# Patient Record
Sex: Female | Born: 1950 | Race: White | Hispanic: No | Marital: Married | Smoking: Never smoker
Health system: Southern US, Community
[De-identification: ages and names within clinical notes are randomized; demographics above are authoritative.]

## PROBLEM LIST (undated history)

## (undated) DIAGNOSIS — Z9889 Other specified postprocedural states: Secondary | ICD-10-CM

## (undated) DIAGNOSIS — M1990 Unspecified osteoarthritis, unspecified site: Secondary | ICD-10-CM

## (undated) DIAGNOSIS — R002 Palpitations: Secondary | ICD-10-CM

## (undated) DIAGNOSIS — E039 Hypothyroidism, unspecified: Secondary | ICD-10-CM

## (undated) DIAGNOSIS — I509 Heart failure, unspecified: Secondary | ICD-10-CM

## (undated) DIAGNOSIS — I499 Cardiac arrhythmia, unspecified: Secondary | ICD-10-CM

## (undated) DIAGNOSIS — E119 Type 2 diabetes mellitus without complications: Secondary | ICD-10-CM

## (undated) DIAGNOSIS — M109 Gout, unspecified: Secondary | ICD-10-CM

## (undated) DIAGNOSIS — R609 Edema, unspecified: Secondary | ICD-10-CM

## (undated) DIAGNOSIS — R112 Nausea with vomiting, unspecified: Secondary | ICD-10-CM

## (undated) DIAGNOSIS — K219 Gastro-esophageal reflux disease without esophagitis: Secondary | ICD-10-CM

## (undated) HISTORY — PX: CHOLECYSTECTOMY: SHX55

## (undated) HISTORY — PX: ABDOMINAL HYSTERECTOMY: SHX81

## (undated) HISTORY — PX: APPENDECTOMY: SHX54

## (undated) HISTORY — PX: NASAL SINUS SURGERY: SHX719

## (undated) HISTORY — PX: EYE SURGERY: SHX253

---

## 2005-07-24 ENCOUNTER — Ambulatory Visit: Payer: Self-pay | Admitting: Orthopedic Surgery

## 2005-07-24 ENCOUNTER — Other Ambulatory Visit: Payer: Self-pay

## 2007-03-05 ENCOUNTER — Ambulatory Visit: Payer: Self-pay | Admitting: Gastroenterology

## 2007-10-05 ENCOUNTER — Inpatient Hospital Stay: Payer: Self-pay | Admitting: Internal Medicine

## 2007-10-05 ENCOUNTER — Other Ambulatory Visit: Payer: Self-pay

## 2007-10-06 ENCOUNTER — Other Ambulatory Visit: Payer: Self-pay

## 2011-06-30 ENCOUNTER — Emergency Department: Payer: Self-pay | Admitting: *Deleted

## 2011-07-17 ENCOUNTER — Inpatient Hospital Stay: Payer: Self-pay | Admitting: Internal Medicine

## 2012-07-16 ENCOUNTER — Emergency Department: Payer: Self-pay | Admitting: Emergency Medicine

## 2012-07-16 LAB — URINALYSIS, COMPLETE
Bilirubin,UR: NEGATIVE
Glucose,UR: NEGATIVE mg/dL (ref 0–75)
Hyaline Cast: 22
Nitrite: NEGATIVE
Specific Gravity: 1.026 (ref 1.003–1.030)
Squamous Epithelial: 3
WBC UR: 21 /HPF (ref 0–5)

## 2012-07-16 LAB — CBC
HCT: 42.7 % (ref 35.0–47.0)
HGB: 14.7 g/dL (ref 12.0–16.0)
MCHC: 34.3 g/dL (ref 32.0–36.0)
MCV: 99 fL (ref 80–100)
RBC: 4.3 10*6/uL (ref 3.80–5.20)

## 2012-07-16 LAB — RAPID INFLUENZA A&B ANTIGENS

## 2012-07-16 LAB — TROPONIN I: Troponin-I: <0.02 ng/mL

## 2012-07-16 LAB — COMPREHENSIVE METABOLIC PANEL
Albumin: 3.6 g/dL (ref 3.4–5.0)
BUN: 16 mg/dL (ref 7–18)
Calcium, Total: 8 mg/dL — ABNORMAL LOW (ref 8.5–10.1)
Chloride: 99 mmol/L (ref 98–107)
Co2: 27 mmol/L (ref 21–32)
EGFR (African American): >60
Glucose: 146 mg/dL — ABNORMAL HIGH (ref 65–99)
Osmolality: 272 (ref 275–301)
Potassium: 3.7 mmol/L (ref 3.5–5.1)
SGOT(AST): 57 U/L — ABNORMAL HIGH (ref 15–37)
Sodium: 134 mmol/L — ABNORMAL LOW (ref 136–145)
Total Protein: 8 g/dL (ref 6.4–8.2)

## 2012-07-16 LAB — MAGNESIUM: Magnesium: 1 mg/dL — ABNORMAL LOW

## 2012-07-16 LAB — LIPASE, BLOOD: Lipase: 99 U/L (ref 73–393)

## 2013-03-19 ENCOUNTER — Ambulatory Visit: Payer: Self-pay | Admitting: Family Medicine

## 2013-08-09 ENCOUNTER — Ambulatory Visit: Payer: Self-pay | Admitting: Family Medicine

## 2013-12-02 ENCOUNTER — Ambulatory Visit: Payer: Self-pay | Admitting: Family Medicine

## 2014-06-16 ENCOUNTER — Emergency Department: Payer: Self-pay | Admitting: Emergency Medicine

## 2014-06-16 LAB — CK TOTAL AND CKMB (NOT AT ARMC)
CK, TOTAL: 84 U/L
CK-MB: 0.6 ng/mL (ref 0.5–3.6)

## 2014-06-16 LAB — CBC WITH DIFFERENTIAL/PLATELET
Basophil #: 0 10*3/uL (ref 0.0–0.1)
Basophil %: 0.3 %
EOS PCT: 1 %
Eosinophil #: 0.1 10*3/uL (ref 0.0–0.7)
HCT: 42.5 % (ref 35.0–47.0)
HGB: 14.3 g/dL (ref 12.0–16.0)
LYMPHS PCT: 22.7 %
Lymphocyte #: 2.9 10*3/uL (ref 1.0–3.6)
MCH: 32.2 pg (ref 26.0–34.0)
MCHC: 33.6 g/dL (ref 32.0–36.0)
MCV: 96 fL (ref 80–100)
Monocyte #: 0.8 x10 3/mm (ref 0.2–0.9)
Monocyte %: 6.7 %
NEUTROS ABS: 8.8 10*3/uL — AB (ref 1.4–6.5)
NEUTROS PCT: 69.3 %
Platelet: 174 10*3/uL (ref 150–440)
RBC: 4.44 10*6/uL (ref 3.80–5.20)
RDW: 13.7 % (ref 11.5–14.5)
WBC: 12.7 10*3/uL — AB (ref 3.6–11.0)

## 2014-06-16 LAB — BASIC METABOLIC PANEL
ANION GAP: 8 (ref 7–16)
BUN: 25 mg/dL — ABNORMAL HIGH (ref 7–18)
Calcium, Total: 8.8 mg/dL (ref 8.5–10.1)
Chloride: 104 mmol/L (ref 98–107)
Co2: 28 mmol/L (ref 21–32)
Creatinine: 0.81 mg/dL (ref 0.60–1.30)
EGFR (African American): >60
Glucose: 103 mg/dL — ABNORMAL HIGH (ref 65–99)
Osmolality: 284 (ref 275–301)
POTASSIUM: 3.5 mmol/L (ref 3.5–5.1)
Sodium: 140 mmol/L (ref 136–145)

## 2014-06-16 LAB — TROPONIN I: Troponin-I: <0.02 ng/mL

## 2014-11-20 NOTE — Discharge Summary (Signed)
PATIENT NAME:  Tammie Stone, Tammie Stone MR#:  [id number] DATE OF BIRTH:  1951-05-18  DATE OF ADMISSION:  07/17/2011 DATE OF DISCHARGE:  07/19/2011  PRIMARY CARE PHYSICIAN: Dr. Dario Guardian PRIMARY CARDIOLOGIST: Dr. Gwen Pounds   REASON FOR ADMISSION: Atrial fibrillation with heart palpitations.  DISCHARGE DIAGNOSES: 1. Atrial fibrillation with rapid ventricular response/paroxysmal atrial fibrillation.  2. Acute on chronic systolic congestive heart failure exacerbation with cardiac decompensation from rapid atrial fibrillation.  3. Possible acute bronchitis with cough and fever.  4. History of hypertension. 5. History of hyperlipidemia. 6. History of type 2 diabetes mellitus.  7. History of hypothyroidism. 8. History of gout. 9. History of chronic systolic congestive heart failure with ejection fraction 49%.   DISCHARGE MEDICATIONS:  1. Pradaxa 150 mg p.o. b.i.d.  2. Sotalol 160 mg b.i.d.  3. Cardizem CD 180 mg daily. 4. Lasix 40 mg daily.  5. Potassium chloride 20 mEq daily. 6. Zithromax 250 mg p.o. daily x4 days. 7. Omnicef 300 mg p.o. every 12 hours x4 days. 8. Flovent HFA 220 mcg 2 puffs inhaled q.12 hours x4 days.  9. Onglyza 5 mg daily.   10. Metformin 500 mg b.i.d.  11. Aspirin 81 mg daily.  12. Allopurinol 300 mg p.o. daily. 13. Synthroid 100 mcg daily. 14. Valtrex 500 mg daily as needed. 15. Fish oil 1000 mg 2 caps p.o. t.i.d.  16. TriCor 145 mg daily.  17. Crestor 10 mg daily. 18. Calcium 600 plus D 1 tab p.o. daily.  19. Omeprazole 20 mg b.i.d.   DISCHARGE DISPOSITION: Home.   DISCHARGE ACTIVITY: As tolerated.   DISCHARGE DIET: Low sodium, low fat, low cholesterol, ADA.   DISCHARGE INSTRUCTIONS:  1. Take medications as prescribed. 2. Return to Emergency Department for recurrence of symptoms or for development of shortness of breath, chest pain, heart palpitations or for worsening cough or fever or chills.   FOLLOW UP INSTRUCTIONS:  1. Follow up with Dr. Dario Guardian within 1 to  2 weeks. Patient needs repeat CMP within one week. 2. Follow up with Dr. Gwen Pounds or Dr. Lady Gary within one week.   CONSULTATION: Cardiology, Dr. Lady Gary.   LABORATORY, DIAGNOSTIC AND RADIOLOGICAL DATA:  Portable chest x-ray on 07/17/2011: Chest shows changes consistent with pulmonary vascular congestion and bilateral interstitial edema suggestive of congestive heart failure.   Repeat chest x-ray PA and lateral 07/19/2011: Observed improvement in the previously noted changes of pulmonary vascular congestion and bilateral interstitial edema. No new pulmonary infiltrates are seen.   Complete metabolic panel normal on admission except for AST slightly elevated at 46, serum glucose 162.  Cardiac enzymes negative x3 sets.   TSH 2.85.   CBC normal on admission except for WBC 11.5.   INR 1.2 on admission.   EKG on admission with atrial fibrillation with rapid ventricular response, heart rate in the 160s.   BRIEF HISTORY/HOSPITAL COURSE: Patient is a pleasant 64 year old female with past medical history of hypertension, hyperlipidemia, diabetes mellitus, gout, hypothyroidism, paroxysmal atrial fibrillation, mild chronic systolic congestive heart failure with ejection fraction 49% per recent echocardiogram as an outpatient who presented to the Emergency Department with complaints of shortness of breath and heart palpitations and was noted to be in rapid atrial fibrillation. Please see dictated admission history and physical for pertinent details surrounding the onset of this hospitalization. Please see below for further details.  1. Shortness of breath and heart palpitations due to atrial fibrillation with rapid ventricular response. Patient was on beta blocker therapy at the time of admission. She was  admitted to the Critical Care Unit and placed on a Cardizem drip for further heart rate control. She was also maintained on sotalol. With the measures mentioned above, patient had converted to normal sinus  rhythm and her heart rate had normalized. She was followed in-house by Dr. Lady Gary of cardiology. Dr. Lady Gary recommended transitioning patient off the IV Cardizem drip on to oral Cardizem with continuation of sotalol as well for heart rate control. Dr. Lady Gary was in agreement with continuation of aspirin and Pradaxa for cerebrovascular accident prophylaxis. Once her heart rate had normalized she was transitioned off IV Cardizem onto oral Cardizem and her heart rate has improved some. For optimal control her Cardizem dose has been advanced on the day of discharge. She will also continue sotalol and will follow up closely with cardiology as outpatient.  2. Acute on chronic systolic congestive heart failure exacerbation. Acute component was felt to be from cardiac decompensation due to rapid atrial fibrillation/sinus tachycardia. She did have some pulmonary edema noted on chest x-ray at the time of admission. She was placed on diuresis with IV Lasix initially. She has diuresed approximately three liters since hospital admission with an approximate 2.4 liters net negative balance since admission. Repeat chest x-ray shows improvement of pulmonary edema. With heart rate control as well as diuresis, her shortness of breath has essentially resolved. Once her condition was noted to have improved she was switched off IV Lasix onto p.o. Lasix. Cardiology recommends keeping patient on Lasix at 40 mg per day orally for now given her low ejection fraction at 49% to help with fluid balance. Her ARB is currently on hold as her blood pressure is currently normotensive and she has also been started on Lasix and Cardizem and we did not want precipitate hypotension so this may be able to be restarted as an outpatient if safe to do so per cardiology and she will follow up with Dr. Gwen Pounds or Dr. Lady Gary closely to see if and when it can be safe to resume her ARB.  3. Possible acute bronchitis. Patient's shortness of breath has essentially  resolved. She was noted to have a cough which is not very productive and she also had fever on 07/18/2011. There was no pneumonia noted on chest x-ray. Therefore, she was felt to have acute bronchitis at this time. She has been started on p.o. Omnicef and Z-Pak as well as one week's course of Flovent. She was advised to return to the ER if she notes worsening of her cough or develops worsening shortness of breath or fever or chills return.  4. Hypertension. Blood pressure well controlled on the day of discharge. Patient to continue Lasix, sotalol as well as Cardizem. 5. Hyperlipidemia. Patient to continue Crestor and TriCor. Crestor should be used cautiously given slightly elevated AST. She is currently asymptomatic and denies any abdominal pain, nausea or vomiting. Recommend repeat CMP per patient's primary care physician within one week. She was also advised to be cautious with using Tylenol or acetaminophen-containing products as well as alcohol.  6. Type 2 diabetes mellitus. Patient was maintained on sliding scale insulin while hospitalized. She can resume metformin and Onglyza upon hospital discharge in addition to ADA diet.  7. Hypothyroidism. Patient to continue Synthroid. Her TSH is within normal limits.  8. History of gout. Patient to continue allopurinol.  9. On 07/19/2011 patient is hemodynamically stable and without any shortness of breath and was noted to be in normal sinus rhythm with controlled heart rate and was felt  to be stable for discharge home with close outpatient follow up to which patient was agreeable.   TIME SPENT ON DISCHARGE: Greater than 30 minutes.   ____________________________ Elon AlasKamran N. Virgene Tirone, MD knl:cms D: 07/23/2011 19:00:49 ET T: 07/25/2011 10:33:38 ET JOB#: [id number]  cc: Elon AlasKamran N. Chamar Broughton, MD, <Dictator> Marlyn CorporalFayegh H. Jadali, MD Lamar BlinksBruce J. Kowalski, MD Darlin PriestlyKenneth A. Lady GaryFath, MD Elon AlasKAMRAN N Wilson Sample MD ELECTRONICALLY SIGNED 08/01/2011 16:24

## 2015-03-28 ENCOUNTER — Encounter: Payer: Self-pay | Admitting: *Deleted

## 2015-03-30 ENCOUNTER — Ambulatory Visit: Payer: Self-pay | Admitting: Anesthesiology

## 2015-03-30 ENCOUNTER — Encounter: Payer: Self-pay | Admitting: *Deleted

## 2015-03-30 ENCOUNTER — Ambulatory Visit
Admission: RE | Admit: 2015-03-30 | Discharge: 2015-03-30 | Disposition: A | Discharge status: Home / Self Care | Payer: Self-pay | Source: Ambulatory Visit | Attending: Ophthalmology | Admitting: Ophthalmology

## 2015-03-30 ENCOUNTER — Encounter
Admission: RE | Disposition: A | Discharge status: Home / Self Care | Payer: Self-pay | Source: Ambulatory Visit | Attending: Ophthalmology

## 2015-03-30 DIAGNOSIS — Z885 Allergy status to narcotic agent status: Secondary | ICD-10-CM | POA: Insufficient documentation

## 2015-03-30 DIAGNOSIS — M1990 Unspecified osteoarthritis, unspecified site: Secondary | ICD-10-CM | POA: Insufficient documentation

## 2015-03-30 DIAGNOSIS — E039 Hypothyroidism, unspecified: Secondary | ICD-10-CM | POA: Insufficient documentation

## 2015-03-30 DIAGNOSIS — E669 Obesity, unspecified: Secondary | ICD-10-CM | POA: Insufficient documentation

## 2015-03-30 DIAGNOSIS — H268 Other specified cataract: Secondary | ICD-10-CM | POA: Insufficient documentation

## 2015-03-30 DIAGNOSIS — Z6841 Body Mass Index (BMI) 40.0 and over, adult: Secondary | ICD-10-CM | POA: Insufficient documentation

## 2015-03-30 DIAGNOSIS — K219 Gastro-esophageal reflux disease without esophagitis: Secondary | ICD-10-CM | POA: Insufficient documentation

## 2015-03-30 DIAGNOSIS — I10 Essential (primary) hypertension: Secondary | ICD-10-CM | POA: Insufficient documentation

## 2015-03-30 DIAGNOSIS — I509 Heart failure, unspecified: Secondary | ICD-10-CM | POA: Insufficient documentation

## 2015-03-30 DIAGNOSIS — Z882 Allergy status to sulfonamides status: Secondary | ICD-10-CM | POA: Insufficient documentation

## 2015-03-30 DIAGNOSIS — E119 Type 2 diabetes mellitus without complications: Secondary | ICD-10-CM | POA: Insufficient documentation

## 2015-03-30 HISTORY — DX: Heart failure, unspecified: I50.9

## 2015-03-30 HISTORY — DX: Hypothyroidism, unspecified: E03.9

## 2015-03-30 HISTORY — DX: Other specified postprocedural states: R11.2

## 2015-03-30 HISTORY — DX: Gastro-esophageal reflux disease without esophagitis: K21.9

## 2015-03-30 HISTORY — PX: CATARACT EXTRACTION W/PHACO: SHX586

## 2015-03-30 HISTORY — DX: Gout, unspecified: M10.9

## 2015-03-30 HISTORY — DX: Edema, unspecified: R60.9

## 2015-03-30 HISTORY — DX: Unspecified osteoarthritis, unspecified site: M19.90

## 2015-03-30 HISTORY — DX: Cardiac arrhythmia, unspecified: I49.9

## 2015-03-30 HISTORY — DX: Type 2 diabetes mellitus without complications: E11.9

## 2015-03-30 HISTORY — DX: Other specified postprocedural states: Z98.890

## 2015-03-30 LAB — GLUCOSE, CAPILLARY: Glucose-Capillary: 133 mg/dL — ABNORMAL HIGH (ref 65–99)

## 2015-03-30 LAB — POTASSIUM: Potassium: 4.2 mmol/L (ref 3.5–5.1)

## 2015-03-30 SURGERY — PHACOEMULSIFICATION, CATARACT, WITH IOL INSERTION
Anesthesia: Monitor Anesthesia Care | Site: Eye | Laterality: Right | Wound class: Clean

## 2015-03-30 MED ORDER — NEOMYCIN-POLYMYXIN-DEXAMETH 0.1 % OP OINT
TOPICAL_OINTMENT | OPHTHALMIC | Status: DC | PRN
  Administered 2015-03-30: 1 via OPHTHALMIC

## 2015-03-30 MED ORDER — MOXIFLOXACIN HCL 0.5 % OP SOLN
OPHTHALMIC | Status: AC
  Filled 2015-03-30: qty 3

## 2015-03-30 MED ORDER — ONDANSETRON HCL 4 MG/2ML IJ SOLN
INTRAMUSCULAR | Status: DC | PRN
  Administered 2015-03-30: 4 mg via INTRAVENOUS

## 2015-03-30 MED ORDER — NEOMYCIN-POLYMYXIN-DEXAMETH 3.5-10000-0.1 OP OINT
TOPICAL_OINTMENT | OPHTHALMIC | Status: AC
  Filled 2015-03-30: qty 3.5

## 2015-03-30 MED ORDER — ARMC OPHTHALMIC DILATING GEL
OPHTHALMIC | Status: AC
  Administered 2015-03-30: 1 via OPHTHALMIC
  Filled 2015-03-30: qty 0.25

## 2015-03-30 MED ORDER — SODIUM CHLORIDE 0.9 % IV SOLN
INTRAVENOUS | Status: DC
  Administered 2015-03-30: 07:00:00 via INTRAVENOUS

## 2015-03-30 MED ORDER — NA HYALUR & NA CHOND-NA HYALUR 0.4-0.35 ML IO KIT
PACK | INTRAOCULAR | Status: DC | PRN
  Administered 2015-03-30: .75 mL via INTRAOCULAR

## 2015-03-30 MED ORDER — SODIUM HYALURONATE 10 MG/ML IO SOLN
INTRAOCULAR | Status: AC
  Filled 2015-03-30: qty 0.85

## 2015-03-30 MED ORDER — SODIUM HYALURONATE 23 MG/ML IO SOLN
INTRAOCULAR | Status: DC | PRN
  Administered 2015-03-30: 0.6 mL via INTRAOCULAR

## 2015-03-30 MED ORDER — TRYPAN BLUE 0.06 % OP SOLN
OPHTHALMIC | Status: DC | PRN
  Administered 2015-03-30: 0.5 mL via INTRAOCULAR

## 2015-03-30 MED ORDER — CEFUROXIME OPHTHALMIC INJECTION 1 MG/0.1 ML
INJECTION | OPHTHALMIC | Status: AC
  Filled 2015-03-30: qty 0.1

## 2015-03-30 MED ORDER — MIDAZOLAM HCL 2 MG/2ML IJ SOLN
INTRAMUSCULAR | Status: DC | PRN
  Administered 2015-03-30: 1 mg via INTRAVENOUS

## 2015-03-30 MED ORDER — BUPIVACAINE HCL (PF) 0.75 % IJ SOLN
INTRAMUSCULAR | Status: AC
  Filled 2015-03-30: qty 10

## 2015-03-30 MED ORDER — ARMC OPHTHALMIC DILATING GEL
1.0000 "application " | OPHTHALMIC | Status: DC | PRN
  Administered 2015-03-30: 1 via OPHTHALMIC

## 2015-03-30 MED ORDER — EPINEPHRINE HCL 1 MG/ML IJ SOLN
INTRAMUSCULAR | Status: AC
  Filled 2015-03-30: qty 1

## 2015-03-30 MED ORDER — MOXIFLOXACIN HCL 0.5 % OP SOLN: 1.0000 [drp] | OPHTHALMIC | Status: DC | PRN

## 2015-03-30 MED ORDER — TETRACAINE HCL 0.5 % OP SOLN
OPHTHALMIC | Status: AC
  Administered 2015-03-30: 1 [drp] via OPHTHALMIC
  Filled 2015-03-30: qty 2

## 2015-03-30 MED ORDER — NA HYALUR & NA CHOND-NA HYALUR 0.55-0.5 ML IO KIT
PACK | INTRAOCULAR | Status: AC
  Filled 2015-03-30: qty 1.05

## 2015-03-30 MED ORDER — TRYPAN BLUE 0.06 % OP SOLN
OPHTHALMIC | Status: AC
  Filled 2015-03-30: qty 0.5

## 2015-03-30 MED ORDER — POVIDONE-IODINE 5 % OP SOLN
OPHTHALMIC | Status: AC
  Administered 2015-03-30: 1 via OPHTHALMIC
  Filled 2015-03-30: qty 30

## 2015-03-30 MED ORDER — EPINEPHRINE HCL 1 MG/ML IJ SOLN
INTRAOCULAR | Status: DC | PRN
  Administered 2015-03-30: 200 mL via OPHTHALMIC

## 2015-03-30 MED ORDER — FENTANYL CITRATE (PF) 100 MCG/2ML IJ SOLN
INTRAMUSCULAR | Status: DC | PRN
  Administered 2015-03-30: 50 ug via INTRAVENOUS

## 2015-03-30 MED ORDER — LIDOCAINE HCL (PF) 4 % IJ SOLN
INTRAMUSCULAR | Status: AC
  Filled 2015-03-30: qty 5

## 2015-03-30 MED ORDER — HYALURONIDASE HUMAN 150 UNIT/ML IJ SOLN
INTRAMUSCULAR | Status: AC
Start: 2015-03-30 — End: 2015-03-30
  Filled 2015-03-30: qty 1

## 2015-03-30 MED ORDER — CARBACHOL 0.01 % IO SOLN
INTRAOCULAR | Status: DC | PRN
Start: 2015-03-30 — End: 2015-03-30
  Administered 2015-03-30: 0.5 mL via INTRAOCULAR

## 2015-03-30 MED ORDER — TETRACAINE HCL 0.5 % OP SOLN
1.0000 [drp] | OPHTHALMIC | Status: AC | PRN
  Administered 2015-03-30: 1 [drp] via OPHTHALMIC

## 2015-03-30 MED ORDER — CEFUROXIME OPHTHALMIC INJECTION 1 MG/0.1 ML
INJECTION | OPHTHALMIC | Status: DC | PRN
  Administered 2015-03-30: 0.1 mL via INTRACAMERAL

## 2015-03-30 MED ORDER — POVIDONE-IODINE 5 % OP SOLN
1.0000 "application " | OPHTHALMIC | Status: AC | PRN
  Administered 2015-03-30: 1 via OPHTHALMIC

## 2015-03-30 SURGICAL SUPPLY — 23 items
CANNULA ANT/CHMB 27G (MISCELLANEOUS) ×1 IMPLANT
CANNULA ANT/CHMB 27GA (MISCELLANEOUS) ×3 IMPLANT
CUP MEDICINE 2OZ PLAST GRAD ST (MISCELLANEOUS) ×3 IMPLANT
GLOVE BIO SURGEON STRL SZ8 (GLOVE) ×3 IMPLANT
GLOVE BIOGEL M 6.5 STRL (GLOVE) ×3 IMPLANT
GLOVE SURG LX 7.5 STRW (GLOVE) ×2
GLOVE SURG LX STRL 7.5 STRW (GLOVE) ×1 IMPLANT
GOWN STRL REUS W/ TWL LRG LVL3 (GOWN DISPOSABLE) ×2 IMPLANT
GOWN STRL REUS W/TWL LRG LVL3 (GOWN DISPOSABLE) ×6
LENS IOL TECNIS 20.0 (Intraocular Lens) ×3 IMPLANT
LENS IOL TECNIS MONO 1P 20.0 (Intraocular Lens) IMPLANT
PACK CATARACT (MISCELLANEOUS) ×3 IMPLANT
PACK CATARACT BRASINGTON LX (MISCELLANEOUS) ×3 IMPLANT
PACK EYE AFTER SURG (MISCELLANEOUS) ×3 IMPLANT
SOL BSS BAG (MISCELLANEOUS) ×3
SOL PREP PVP 2OZ (MISCELLANEOUS) ×3
SOLUTION BSS BAG (MISCELLANEOUS) ×1 IMPLANT
SOLUTION PREP PVP 2OZ (MISCELLANEOUS) ×1 IMPLANT
SYR 3ML LL SCALE MARK (SYRINGE) ×5 IMPLANT
SYR 5ML LL (SYRINGE) ×3 IMPLANT
SYR TB 1ML 27GX1/2 LL (SYRINGE) ×3 IMPLANT
WATER STERILE IRR 1000ML POUR (IV SOLUTION) ×3 IMPLANT
WIPE NON LINTING 3.25X3.25 (MISCELLANEOUS) ×3 IMPLANT

## 2015-03-30 NOTE — Anesthesia Preprocedure Evaluation (Signed)
Anesthesia Evaluation  Patient identified by MRN, date of birth, ID band Patient awake    Reviewed: Allergy & Precautions, NPO status , Patient's Chart, lab work & pertinent test results, reviewed documented beta blocker date and time   History of Anesthesia Complications (+) PONV and history of anesthetic complications  Airway Mallampati: III  TM Distance: >3 FB     Dental  (+) Chipped   Pulmonary          Cardiovascular hypertension, Pt. on medications and Pt. on home beta blockers +CHF + dysrhythmias     Neuro/Psych    GI/Hepatic GERD-  ,  Endo/Other  diabetes, Type 2Hypothyroidism   Renal/GU      Musculoskeletal  (+) Arthritis -,   Abdominal   Peds  Hematology   Anesthesia Other Findings Obesity.  Reproductive/Obstetrics                             Anesthesia Physical Anesthesia Plan  ASA: III  Anesthesia Plan: MAC   Post-op Pain Management:    Induction:   Airway Management Planned: Nasal Cannula  Additional Equipment:   Intra-op Plan:   Post-operative Plan:   Informed Consent: I have reviewed the patients History and Physical, chart, labs and discussed the procedure including the risks, benefits and alternatives for the proposed anesthesia with the patient or authorized representative who has indicated his/her understanding and acceptance.     Plan Discussed with: CRNA  Anesthesia Plan Comments:         Anesthesia Quick Evaluation

## 2015-03-30 NOTE — Op Note (Signed)
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  Vision Blue dye was used to stain the lens capsule.  LENS:   Implant Name Type Inv. Item Serial No. Manufacturer Lot No. LRB No. Used  LENS IMPL INTRAOC ZCB00 20.0 - [id number] Intraocular Lens LENS IMPL INTRAOC ZCB00 20.0 [id number]   Right 1       ULTRASOUND TIME: 22 % of 1 minutes 30 seconds, CDE 19.6  SURGEON:  Deirdre Evener, MD   ANESTHESIA:  Topical with tetracaine drops and 2% Xylocaine jelly.   COMPLICATIONS:  None.   DESCRIPTION OF PROCEDURE:  The patient was identified in the holding room and transported to the operating room and placed in the supine position under the operating microscope. Theright eye was identified as the operative eye and it was prepped and draped in the usual sterile ophthalmic fashion.  A 1 millimeter clear-corneal paracentesis was made at the 12:00 position.  The anterior chamber was filled with Healon 5 viscoelastic.  A 2.4 millimeter keratome was used to make a near-clear corneal incision at the 9:00 position.  The anterior chamber was filled with Healon 5 viscoelastic.  Vision Blue dye was then injected under the viscoelastic to stain the lens capsule.  BSS was then used to wash the dye out.  Additional Healon 5 was placed into the anterior chamber.  A curvilinear capsulorrhexis was made with a cystotome and capsulorrhexis forceps.  Balanced salt solution was used to hydrodissect and hydrodelineate the nucleus.  Viscoat was then placed in the anterior chamber.   Phacoemulsification was then used in stop and chop fashion to remove the lens nucleus and epinucleus.  The remaining cortex was then removed using the irrigation and aspiration  handpiece. Provisc was then placed into the capsular bag to distend it for lens placement.  A 20.0 -diopter lens was then injected into the capsular bag.  The remaining viscoelastic was aspirated.   Wounds were hydrated with balanced salt solution.  The anterior chamber was inflated to a physiologic pressure with balanced salt solution. Cefuroxime 0.1 ml of a /ml solution was injected into the anterior chamber for a dose of 1 mg of intracameral antibiotic at the completion of the case. Miostat was placed into the anterior chamber to constrict the pupil.  No wound leaks were noted.  Topical Vigamox drops and Maxitrol ointment were applied to the eye.  The patient was taken to the recovery room in stable condition without complications of anesthesia or surgery.  Billye Pickerel 03/30/2015, 8:41 AM

## 2015-03-30 NOTE — Transfer of Care (Signed)
Immediate Anesthesia Transfer of Care Note  Patient: Tammie Stone  Procedure(s) Performed: Procedure(s) with comments: CATARACT EXTRACTION PHACO AND INTRAOCULAR LENS PLACEMENT (IOC) (Right) - [place]: 01:29.7 AP%; 21.9 CDE: 19.58  Lot # [id number]  Patient Location: PHASE II  Anesthesia Type:MAC  Level of Consciousness: Awake, Alert, Oriented  Airway & Oxygen Therapy: Patient Spontanous Breathing and Patient on room air   Post-op Assessment: Report given to RN and Post -op Vital signs reviewed and stable  Post vital signs: Reviewed and stable  Last Vitals:  Filed Vitals:   03/30/15 0838  BP: 116/68  Pulse: 107  Temp: 36.2 C  Resp:     Complications: No apparent anesthesia complications

## 2015-03-30 NOTE — Discharge Instructions (Signed)
AMBULATORY SURGERY  DISCHARGE INSTRUCTIONS   1) The drugs that you were given will stay in your system until tomorrow so for the next 24 hours you should not:  A) Drive an automobile B) Make any legal decisions C) Drink any alcoholic beverage   2) You may resume regular meals tomorrow.  Today it is better to start with liquids and gradually work up to solid foods.  You may eat anything you prefer, but it is better to start with liquids, then soup and crackers, and gradually work up to solid foods.   3) Please notify your doctor immediately if you have any unusual bleeding, trouble breathing, redness and pain at the surgery site, drainage, fever, or pain not relieved by medication.    4) Additional Instructions:   Eye Surgery Discharge Instructions  Expect mild scratchy sensation or mild soreness. DO NOT RUB YOUR EYE!  The day of surgery:  Minimal physical activity, but bed rest is not required  No reading, computer work, or close hand work  No bending, lifting, or straining.  May watch TV  For 24 hours:  No driving, legal decisions, or alcoholic beverages  Safety precautions  Eat anything you prefer: It is better to start with liquids, then soup then solid foods.  _____ Eye patch should be worn until postoperative exam tomorrow.  ____ Solar shield eyeglasses should be worn for comfort in the sunlight/patch while sleeping  Resume all regular medications including aspirin or Coumadin if these were discontinued prior to surgery. You may shower, bathe, shave, or wash your hair. Tylenol may be taken for mild discomfort.  Call your doctor if you experience significant pain, nausea, or vomiting, fever > 101 or other signs of infection. [phone] or [phone] Specific instructions:  Follow-up Information    Follow up with Lockie Mola, MD In 1 day.   Specialty:  Ophthalmology   Why:  September 2 at 10:40am   Contact information:   [address]   [place] [address] [phone]          Please contact your physician with any problems or Same Day Surgery at [phone], Monday through Friday 6 am to 4 pm, or [place] at [facility] number at [phone].

## 2015-03-30 NOTE — Anesthesia Postprocedure Evaluation (Signed)
  Anesthesia Post-op Note  Patient: Tammie Stone  Procedure(s) Performed: Procedure(s) with comments: CATARACT EXTRACTION PHACO AND INTRAOCULAR LENS PLACEMENT (IOC) (Right) - [place]: 01:29.7 AP%; 21.9 CDE: 19.58  Lot # [id number]  Anesthesia type:MAC  Patient location: Phase II  Post pain: Pain level controlled  Post assessment: Post-op Vital signs reviewed, Patient's Cardiovascular Status Stable, Respiratory Function Stable, Patent Airway and No signs of Nausea or vomiting  Post vital signs: Reviewed and stable  Last Vitals:  Filed Vitals:   03/30/15 0838  BP: 116/68  Pulse: 107  Temp: 36.2 C  Resp:     Level of consciousness: awake, alert  and patient cooperative  Complications: No apparent anesthesia complications

## 2015-03-30 NOTE — Anesthesia Procedure Notes (Signed)
Procedure Name: MAC Date/Time: 03/30/2015 8:07 AM Performed by: Stormy Fabian Pre-anesthesia Checklist: Patient identified, Emergency Drugs available, Suction available and Patient being monitored Patient Re-evaluated:Patient Re-evaluated prior to inductionOxygen Delivery Method: Nasal cannula

## 2015-03-30 NOTE — H&P (Signed)
  The History and Physical notes were scanned in.  The patient remains stable and unchanged from the H&P.   Previous H&P reviewed, patient examined, and there are no changes.  Tammie Stone 03/30/2015 8:00 AM

## 2015-07-01 ENCOUNTER — Emergency Department
Admission: EM | Admit: 2015-07-01 | Discharge: 2015-07-01 | Disposition: A | Discharge status: Home / Self Care | Payer: Self-pay | Attending: Emergency Medicine | Admitting: Emergency Medicine

## 2015-07-01 ENCOUNTER — Encounter: Payer: Self-pay | Admitting: Emergency Medicine

## 2015-07-01 ENCOUNTER — Emergency Department: Payer: Self-pay

## 2015-07-01 DIAGNOSIS — Y9289 Other specified places as the place of occurrence of the external cause: Secondary | ICD-10-CM | POA: Insufficient documentation

## 2015-07-01 DIAGNOSIS — Z7984 Long term (current) use of oral hypoglycemic drugs: Secondary | ICD-10-CM | POA: Insufficient documentation

## 2015-07-01 DIAGNOSIS — Y9301 Activity, walking, marching and hiking: Secondary | ICD-10-CM | POA: Insufficient documentation

## 2015-07-01 DIAGNOSIS — S92352A Displaced fracture of fifth metatarsal bone, left foot, initial encounter for closed fracture: Secondary | ICD-10-CM | POA: Insufficient documentation

## 2015-07-01 DIAGNOSIS — Y998 Other external cause status: Secondary | ICD-10-CM | POA: Insufficient documentation

## 2015-07-01 DIAGNOSIS — E119 Type 2 diabetes mellitus without complications: Secondary | ICD-10-CM | POA: Insufficient documentation

## 2015-07-01 DIAGNOSIS — W108XXA Fall (on) (from) other stairs and steps, initial encounter: Secondary | ICD-10-CM | POA: Insufficient documentation

## 2015-07-01 DIAGNOSIS — Z79899 Other long term (current) drug therapy: Secondary | ICD-10-CM | POA: Insufficient documentation

## 2015-07-01 DIAGNOSIS — S92302A Fracture of unspecified metatarsal bone(s), left foot, initial encounter for closed fracture: Secondary | ICD-10-CM

## 2015-07-01 DIAGNOSIS — Z794 Long term (current) use of insulin: Secondary | ICD-10-CM | POA: Insufficient documentation

## 2015-07-01 MED ORDER — HYDROCODONE-ACETAMINOPHEN 5-325 MG PO TABS: 1.0000 | ORAL_TABLET | ORAL | Status: DC | PRN

## 2015-07-01 NOTE — ED Provider Notes (Signed)
[facility] Emergency Department Provider Note  ____________________________________________  Time seen: Approximately 2:00 PM  I have reviewed the triage vital signs and the nursing notes.   HISTORY  Chief Complaint Foot Injury    HPI Tammie Stone is a 64 y.o. female who presents emergency Department with left foot pain status post an injury. She states that she was walking down some stairs, took the last step, rotated her ankle in an inversion manner. She states that while doing so she fell. She denies hitting her head or lose consciousness. She states that she has a history of repeated ankle sprains in left ankle. She is now endorsing some pain in the mid foot on the lateral aspect. Patient endorses edema to area as well. Patient denies any numbness or tingling in distal extremity. She states the fall was mechanical in nature.Pain is constant, mild to moderate at rest but severe with weightbearing.   Past Medical History  Diagnosis Date  . Dysrhythmia     afib  . CHF (congestive heart failure) (HCC)   . GERD (gastroesophageal reflux disease)   . Diabetes mellitus without complication (HCC)   . Gout   . Hypothyroidism   . Arthritis   . PONV (postoperative nausea and vomiting)   . Edema     feet    There are no active problems to display for this patient.   Past Surgical History  Procedure Laterality Date  . Appendectomy    . Cholecystectomy    . Abdominal hysterectomy    . Cataract extraction w/phaco Right 03/30/2015    Procedure: CATARACT EXTRACTION PHACO AND INTRAOCULAR LENS PLACEMENT (IOC);  Surgeon: Lockie Molahadwick Brasington, MD;  Location: [facility] ORS;  Service: Ophthalmology;  Laterality: Right;  US: 01:29.7 AP%; 21.9 CDE: 19.58  Lot # [id number] H    Current Outpatient Rx  Name  Route  Sig  Dispense  Refill  . allopurinol (ZYLOPRIM) 100 MG tablet   Oral   Take 100 mg by mouth daily.         Marland Kitchen. diltiazem (DILACOR XR) 180 MG 24 hr capsule   Oral   Take 180 mg by mouth daily.         . furosemide (LASIX) 40 MG tablet   Oral   Take 40 mg by mouth 2 (two) times daily.         Marland Kitchen. HYDROcodone-acetaminophen (NORCO/VICODIN) 5-325 MG tablet   Oral   Take 1 tablet by mouth every 4 (four) hours as needed for moderate pain.   20 tablet   0   . hydrOXYzine (ATARAX/VISTARIL) 25 MG tablet   Oral   Take 25 mg by mouth at bedtime.         . insulin detemir (LEVEMIR) 100 UNIT/ML injection   Subcutaneous   Inject 70 Units into the skin at bedtime.         Marland Kitchen. levothyroxine (SYNTHROID, LEVOTHROID) 112 MCG tablet   Oral   Take 112 mcg by mouth daily before breakfast.         . metoprolol succinate (TOPROL-XL) 50 MG 24 hr tablet   Oral   Take 50 mg by mouth daily. Take with or immediately following a meal.         . potassium chloride SA (K-DUR,KLOR-CON) 20 MEQ tablet   Oral   Take 20 mEq by mouth daily.         . ranitidine (ZANTAC) 150 MG tablet   Oral   Take 150 mg by  mouth at bedtime.         Marland Kitchen SAXagliptin-MetFORMIN (KOMBIGLYZE XR PO)   Oral   Take 500 mg by mouth daily.           Allergies Codeine and Sulfa antibiotics  History reviewed. No pertinent family history.  Social History Social History  Substance Use Topics  . Smoking status: Never Smoker   . Smokeless tobacco: None  . Alcohol Use: No    Review of Systems Constitutional: No fever/chills Eyes: No visual changes. ENT: No sore throat. Cardiovascular: Denies chest pain. Respiratory: Denies shortness of breath. Gastrointestinal: No abdominal pain.  No nausea, no vomiting.  No diarrhea.  No constipation. Genitourinary: Negative for dysuria. Musculoskeletal: Negative for back pain. Dorsalis left ankle/foot pain. Skin: Negative for rash. Neurological: Negative for headaches, focal weakness or numbness.  10-point ROS otherwise negative.  ____________________________________________   PHYSICAL EXAM:  VITAL SIGNS: ED Triage  Vitals  Enc Vitals Group     BP 07/01/15 1333 117/89 mmHg     Pulse Rate 07/01/15 1333 65     Resp 07/01/15 1333 20     Temp 07/01/15 1333 98.1 F (36.7 C)     Temp Source 07/01/15 1333 Oral     SpO2 07/01/15 1333 97 %     Weight 07/01/15 1333 250 lb (113.399 kg)     Height 07/01/15 1333  (1.702 m)     Head Cir --      Peak Flow --      Pain Score 07/01/15 1323 8     Pain Loc --      Pain Edu? --      Excl. in GC? --     Constitutional: Alert and oriented. Well appearing and in no acute distress. Eyes: Conjunctivae are normal. PERRL. EOMI. Head: Atraumatic. Nose: No congestion/rhinnorhea. Mouth/Throat: Mucous membranes are moist.  Oropharynx non-erythematous. Neck: No stridor.   Cardiovascular: Normal rate, regular rhythm. Grossly normal heart sounds.  Good peripheral circulation. Respiratory: Normal respiratory effort.  No retractions. Lungs CTAB. Gastrointestinal: Soft and nontender. No distention. No abdominal bruits. No CVA tenderness. Musculoskeletal: No lower extremity tenderness nor edema.  No joint effusions. Edema noted to lateral aspect mid foot left foot compared to right foot. Mild edema noted to left lateral malleolus. Patient is very tender to palpation over the base of the fifth metatarsal. No other tenderness to palpation. Full range of motion to ankle joint. Sensation and pulses are intact in extremity. Neurologic:  Normal speech and language. No gross focal neurologic deficits are appreciated. No gait instability. Skin:  Skin is warm, dry and intact. No rash noted. Psychiatric: Mood and affect are normal. Speech and behavior are normal.  ____________________________________________   LABS (all labs ordered are listed, but only abnormal results are displayed)  Labs Reviewed - No data to display ____________________________________________  EKG   ____________________________________________  RADIOLOGY  Left ankle x-ray Impression: Acute-appearing  fifth metatarsal base fracture. Chronic-appearing medial and lateral malleolus fractures.  Images were reviewed by myself. ____________________________________________   PROCEDURES  Procedure(s) performed: Yes, splint application, see procedure note(s).   SPLINT APPLICATION Date/Time: 2:41 PM Authorized by: Racheal Patches Consent: Verbal consent obtained. Risks and benefits: risks, benefits and alternatives were discussed Consent given by: patient Splint applied by: orthopedic technician Location details: Ankle  Splint type: Posterior OCL  Supplies used: Ortho-Glass  Post-procedure: The splinted body part was neurovascularly unchanged following the procedure. Patient tolerance: Patient tolerated the procedure well with no immediate complications.  Critical Care performed: No  ____________________________________________   INITIAL IMPRESSION / ASSESSMENT AND PLAN / ED COURSE  Pertinent labs & imaging results that were available during my care of the patient were reviewed by me and considered in my medical decision making (see chart for details).  Patient's history, symptoms, physical exam are taken and the consideration for diagnosis. I advised patient of findings and diagnosis and she verbalizes understanding of same. I explained the treatment plan to the patient and the patient verbalizes understanding and compliance with same. Patient is splinted here in the emergency department and given prescription for pain management until she can see orthopedics. Patient is to follow-up with primary care provider or specialist, Dr. Deeann Saint her personal orthopedic provider, provided on paperwork for further evaluation and treatment should symptoms persist past treatment course. All of the patient's questions are answered. ____________________________________________   FINAL CLINICAL IMPRESSION(S) / ED DIAGNOSES  Final diagnoses:  Fracture of fifth metatarsal bone, left,  closed, initial encounter      Racheal Patches, PA-C 07/01/15 1441  Jene Every, MD 07/01/15 1527

## 2015-07-01 NOTE — Discharge Instructions (Signed)
Cast or Splint Care °Casts and splints support injured limbs and keep bones from moving while they heal. It is important to care for your cast or splint at home.   °HOME CARE INSTRUCTIONS °· Keep the cast or splint uncovered during the drying period. It can take 24 to 48 hours to dry if it is made of plaster. A fiberglass cast will dry in less than 1 hour. °· Do not rest the cast on anything harder than a pillow for the first 24 hours. °· Do not put weight on your injured limb or apply pressure to the cast until your health care provider gives you permission. °· Keep the cast or splint dry. Wet casts or splints can lose their shape and may not support the limb as well. A wet cast that has lost its shape can also create harmful pressure on your skin when it dries. Also, wet skin can become infected. °· Cover the cast or splint with a plastic bag when bathing or when out in the rain or snow. If the cast is on the trunk of the body, take sponge baths until the cast is removed. °· If your cast does become wet, dry it with a towel or a blow dryer on the cool setting only. °· Keep your cast or splint clean. Soiled casts may be wiped with a moistened cloth. °· Do not place any hard or soft foreign objects under your cast or splint, such as cotton, toilet paper, lotion, or powder. °· Do not try to scratch the skin under the cast with any object. The object could get stuck inside the cast. Also, scratching could lead to an infection. If itching is a problem, use a blow dryer on a cool setting to relieve discomfort. °· Do not trim or cut your cast or remove padding from inside of it. °· Exercise all joints next to the injury that are not immobilized by the cast or splint. For example, if you have a long leg cast, exercise the hip joint and toes. If you have an arm cast or splint, exercise the shoulder, elbow, thumb, and fingers. °· Elevate your injured arm or leg on 1 or 2 pillows for the first 1 to 3 days to decrease  swelling and pain. It is best if you can comfortably elevate your cast so it is higher than your heart. °SEEK MEDICAL CARE IF:  °· Your cast or splint cracks. °· Your cast or splint is too tight or too loose. °· You have unbearable itching inside the cast. °· Your cast becomes wet or develops a soft spot or area. °· You have a bad smell coming from inside your cast. °· You get an object stuck under your cast. °· Your skin around the cast becomes red or raw. °· You have new pain or worsening pain after the cast has been applied. °SEEK IMMEDIATE MEDICAL CARE IF:  °· You have fluid leaking through the cast. °· You are unable to move your fingers or toes. °· You have discolored (blue or white), cool, painful, or very swollen fingers or toes beyond the cast. °· You have tingling or numbness around the injured area. °· You have severe pain or pressure under the cast. °· You have any difficulty with your breathing or have shortness of breath. °· You have chest pain. °  °This information is not intended to replace advice given to you by your health care provider. Make sure you discuss any questions you have with your health care   provider. °  °Document Released: 07/12/2000 Document Revised: 05/05/2013 Document Reviewed: 01/21/2013 °Elsevier Interactive Patient Education ©2016 [employer] ° °Metatarsal Fracture °A metatarsal fracture is a break in a metatarsal bone. Metatarsal bones connect your toe bones to your ankle bones. °CAUSES °This type of fracture may be caused by: °· A sudden twisting of your foot. °· A fall onto your foot. °· Overuse or repetitive exercise. °RISK FACTORS °This condition is more likely to develop in people who: °· Play contact sports. °· Have a bone disease. °· Have a low calcium level. °SYMPTOMS °Symptoms of this condition include: °· Pain that is worse when walking or standing. °· Pain when pressing on the foot or moving the toes. °· Swelling. °· Bruising on the top or bottom of the foot. °· A  foot that appears shorter than the other one. °DIAGNOSIS °This condition is diagnosed with a physical exam. You may also have imaging tests, such as: °· X-rays. °· A CT scan. °· MRI. °TREATMENT °Treatment for this condition depends on its severity and whether a bone has moved out of place. Treatment may involve: °· Rest. °· Wearing foot support such as a cast, splint, or boot for several weeks. °· Using crutches. °· Surgery to move bones back into the right position. Surgery is usually needed if there are many pieces of broken bone or bones that are very out of place (displaced fracture). °· Physical therapy. This may be needed to help you regain full movement and strength in your foot. °You will need to return to your health care provider to have X-rays taken until your bones heal. Your health care provider will look at the X-rays to make sure that your foot is healing well. °HOME CARE INSTRUCTIONS  °If You Have a Cast: °· Do not stick anything inside the cast to scratch your skin. Doing that increases your risk of infection. °· Check the skin around the cast every day. Report any concerns to your health care provider. You may put lotion on dry skin around the edges of the cast. Do not apply lotion to the skin underneath the cast. °· Keep the cast clean and dry. °If You Have a Splint or a Supportive Boot: °· Wear it as directed by your health care provider. Remove it only as directed by your health care provider. °· Loosen it if your toes become numb and tingle, or if they turn cold and blue. °· Keep it clean and dry. °Bathing °· Do not take baths, swim, or use a hot tub until your health care provider approves. Ask your health care provider if you can take showers. You may only be allowed to take sponge baths for bathing. °· If your health care provider approves bathing and showering, cover the cast or splint with a watertight plastic bag to protect it from water. Do not let the cast or splint get wet. °Managing  Pain, Stiffness, and Swelling °· If directed, apply ice to the injured area (if you have a splint, not a cast). °¨ Put ice in a plastic bag. °¨ Place a towel between your skin and the bag. °¨ Leave the ice on for 20 minutes, 2-3 times per day. °· Move your toes often to avoid stiffness and to lessen swelling. °· Raise (elevate) the injured area above the level of your heart while you are sitting or lying down. °Driving °· Do not drive or operate heavy machinery while taking pain medicine. °· Do not drive while wearing foot support   on a foot that you use for driving. °Activity °· Return to your normal activities as directed by your health care provider. Ask your health care provider what activities are safe for you. °· Perform exercises as directed by your health care provider or physical therapist. °Safety °· Do not use the injured foot to support your body weight until your health care provider says that you can. Use crutches as directed by your health care provider. °General Instructions °· Do not put pressure on any part of the cast or splint until it is fully hardened. This may take several hours. °· Do not use any tobacco products, including cigarettes, chewing tobacco, or e-cigarettes. Tobacco can delay bone healing. If you need help quitting, ask your health care provider. °· Take medicines only as directed by your health care provider. °· Keep all follow-up visits as directed by your health care provider. This is important. °SEEK MEDICAL CARE IF: °· You have a fever. °· Your cast, splint, or boot is too loose or too tight. °· Your cast, splint, or boot is damaged. °· Your pain medicine is not helping. °· You have pain, tingling, or numbness in your foot that is not going away. °SEEK IMMEDIATE MEDICAL CARE IF: °· You have severe pain. °· You have tingling or numbness in your foot that is getting worse. °· Your foot feels cold or becomes numb. °· Your foot changes color. °  °This information is not intended to  replace advice given to you by your health care provider. Make sure you discuss any questions you have with your health care provider. °  °Document Released: 04/06/2002 Document Revised: 11/29/2014 Document Reviewed: 05/11/2014 °Elsevier Interactive Patient Education ©2016 [employer] ° °

## 2015-07-01 NOTE — ED Notes (Signed)
Reports tripped and fell, pain to left foot

## 2015-11-14 ENCOUNTER — Encounter: Payer: Self-pay | Admitting: *Deleted

## 2015-11-23 ENCOUNTER — Ambulatory Visit: Admission: RE | Admit: 2015-11-23 | Payer: Self-pay | Source: Ambulatory Visit | Admitting: Ophthalmology

## 2015-11-23 ENCOUNTER — Encounter: Admission: RE | Payer: Self-pay | Source: Ambulatory Visit

## 2015-11-23 HISTORY — DX: Palpitations: R00.2

## 2015-11-23 SURGERY — PHACOEMULSIFICATION, CATARACT, WITH IOL INSERTION
Anesthesia: Choice | Laterality: Left

## 2015-12-20 ENCOUNTER — Encounter: Payer: Self-pay | Admitting: *Deleted

## 2015-12-20 NOTE — Pre-Procedure Instructions (Signed)
Cleared by dr Gwen Poundskowalski 12/06/15

## 2015-12-29 MED ORDER — TETRACAINE HCL 0.5 % OP SOLN: 1.0000 [drp] | Freq: Once | OPHTHALMIC | Status: DC

## 2015-12-29 MED ORDER — MOXIFLOXACIN HCL 0.5 % OP SOLN: 1.0000 [drp] | OPHTHALMIC | Status: DC | PRN

## 2015-12-29 MED ORDER — POVIDONE-IODINE 5 % OP SOLN: 1.0000 "application " | Freq: Once | OPHTHALMIC | Status: DC

## 2015-12-29 MED ORDER — ARMC OPHTHALMIC DILATING GEL: 1.0000 "application " | OPHTHALMIC | Status: DC | PRN

## 2015-12-29 MED ORDER — SODIUM CHLORIDE 0.9 % IV SOLN: INTRAVENOUS | Status: DC

## 2016-01-01 ENCOUNTER — Encounter
Admission: RE | Disposition: A | Discharge status: Home / Self Care | Payer: Self-pay | Source: Ambulatory Visit | Attending: Ophthalmology

## 2016-01-01 ENCOUNTER — Encounter: Payer: Self-pay | Admitting: *Deleted

## 2016-01-01 ENCOUNTER — Ambulatory Visit: Payer: Medicaid Other | Admitting: Anesthesiology

## 2016-01-01 ENCOUNTER — Ambulatory Visit
Admission: RE | Admit: 2016-01-01 | Discharge: 2016-01-01 | Disposition: A | Discharge status: Home / Self Care | Payer: Medicaid Other | Source: Ambulatory Visit | Attending: Ophthalmology | Admitting: Ophthalmology

## 2016-01-01 DIAGNOSIS — E119 Type 2 diabetes mellitus without complications: Secondary | ICD-10-CM | POA: Diagnosis not present

## 2016-01-01 DIAGNOSIS — Z79899 Other long term (current) drug therapy: Secondary | ICD-10-CM | POA: Insufficient documentation

## 2016-01-01 DIAGNOSIS — Z9842 Cataract extraction status, left eye: Secondary | ICD-10-CM | POA: Insufficient documentation

## 2016-01-01 DIAGNOSIS — Z791 Long term (current) use of non-steroidal anti-inflammatories (NSAID): Secondary | ICD-10-CM | POA: Diagnosis not present

## 2016-01-01 DIAGNOSIS — Z882 Allergy status to sulfonamides status: Secondary | ICD-10-CM | POA: Diagnosis not present

## 2016-01-01 DIAGNOSIS — H2512 Age-related nuclear cataract, left eye: Secondary | ICD-10-CM | POA: Diagnosis not present

## 2016-01-01 DIAGNOSIS — M1990 Unspecified osteoarthritis, unspecified site: Secondary | ICD-10-CM | POA: Diagnosis not present

## 2016-01-01 DIAGNOSIS — Z7901 Long term (current) use of anticoagulants: Secondary | ICD-10-CM | POA: Insufficient documentation

## 2016-01-01 DIAGNOSIS — Z9889 Other specified postprocedural states: Secondary | ICD-10-CM | POA: Diagnosis not present

## 2016-01-01 DIAGNOSIS — M109 Gout, unspecified: Secondary | ICD-10-CM | POA: Insufficient documentation

## 2016-01-01 DIAGNOSIS — Z90710 Acquired absence of both cervix and uterus: Secondary | ICD-10-CM | POA: Diagnosis not present

## 2016-01-01 DIAGNOSIS — I4891 Unspecified atrial fibrillation: Secondary | ICD-10-CM | POA: Insufficient documentation

## 2016-01-01 DIAGNOSIS — Z885 Allergy status to narcotic agent status: Secondary | ICD-10-CM | POA: Insufficient documentation

## 2016-01-01 DIAGNOSIS — Z794 Long term (current) use of insulin: Secondary | ICD-10-CM | POA: Diagnosis not present

## 2016-01-01 DIAGNOSIS — H269 Unspecified cataract: Secondary | ICD-10-CM | POA: Diagnosis present

## 2016-01-01 DIAGNOSIS — Z9049 Acquired absence of other specified parts of digestive tract: Secondary | ICD-10-CM | POA: Insufficient documentation

## 2016-01-01 HISTORY — PX: CATARACT EXTRACTION W/PHACO: SHX586

## 2016-01-01 LAB — GLUCOSE, CAPILLARY: GLUCOSE-CAPILLARY: 139 mg/dL — AB (ref 65–99)

## 2016-01-01 SURGERY — PHACOEMULSIFICATION, CATARACT, WITH IOL INSERTION
Anesthesia: Monitor Anesthesia Care | Site: Eye | Laterality: Left | Wound class: Clean

## 2016-01-01 MED ORDER — MOXIFLOXACIN HCL 0.5 % OP SOLN: 1.0000 [drp] | OPHTHALMIC | Status: DC | PRN

## 2016-01-01 MED ORDER — NEOMYCIN-POLYMYXIN-DEXAMETH 0.1 % OP OINT
TOPICAL_OINTMENT | OPHTHALMIC | Status: DC | PRN
  Administered 2016-01-01: 1 via OPHTHALMIC

## 2016-01-01 MED ORDER — TETRACAINE HCL 0.5 % OP SOLN
1.0000 [drp] | Freq: Once | OPHTHALMIC | Status: AC
  Administered 2016-01-01: 1 [drp] via OPHTHALMIC

## 2016-01-01 MED ORDER — LIDOCAINE HCL (PF) 4 % IJ SOLN
INTRAOCULAR | Status: DC | PRN
  Administered 2016-01-01: 09:00:00 via OPHTHALMIC

## 2016-01-01 MED ORDER — CEFUROXIME OPHTHALMIC INJECTION 1 MG/0.1 ML
INJECTION | OPHTHALMIC | Status: AC
  Filled 2016-01-01: qty 0.1

## 2016-01-01 MED ORDER — ARMC OPHTHALMIC DILATING GEL
1.0000 "application " | OPHTHALMIC | Status: AC | PRN
  Administered 2016-01-01 (×2): 1 via OPHTHALMIC

## 2016-01-01 MED ORDER — TETRACAINE HCL 0.5 % OP SOLN
OPHTHALMIC | Status: AC
  Administered 2016-01-01: 1 [drp] via OPHTHALMIC
  Filled 2016-01-01: qty 2

## 2016-01-01 MED ORDER — POVIDONE-IODINE 5 % OP SOLN
1.0000 "application " | Freq: Once | OPHTHALMIC | Status: AC
  Administered 2016-01-01: 1 via OPHTHALMIC

## 2016-01-01 MED ORDER — CARBACHOL 0.01 % IO SOLN
INTRAOCULAR | Status: DC | PRN
  Administered 2016-01-01: 0.5 mL via INTRAOCULAR

## 2016-01-01 MED ORDER — SODIUM CHLORIDE 0.9 % IV SOLN
INTRAVENOUS | Status: DC
  Administered 2016-01-01: 07:00:00 via INTRAVENOUS

## 2016-01-01 MED ORDER — NA HYALUR & NA CHOND-NA HYALUR 0.4-0.35 ML IO KIT
PACK | INTRAOCULAR | Status: DC | PRN
  Administered 2016-01-01: .4 mL via INTRAOCULAR

## 2016-01-01 MED ORDER — EPINEPHRINE HCL 1 MG/ML IJ SOLN
INTRAMUSCULAR | Status: AC
  Filled 2016-01-01: qty 1

## 2016-01-01 MED ORDER — FENTANYL CITRATE (PF) 100 MCG/2ML IJ SOLN
INTRAMUSCULAR | Status: DC | PRN
  Administered 2016-01-01: 50 ug via INTRAVENOUS

## 2016-01-01 MED ORDER — NA HYALUR & NA CHOND-NA HYALUR 0.55-0.5 ML IO KIT
PACK | INTRAOCULAR | Status: AC
  Filled 2016-01-01: qty 1.05

## 2016-01-01 MED ORDER — ARMC OPHTHALMIC DILATING GEL
OPHTHALMIC | Status: AC
  Administered 2016-01-01: 1 via OPHTHALMIC
  Filled 2016-01-01: qty 0.25

## 2016-01-01 MED ORDER — CEFUROXIME OPHTHALMIC INJECTION 1 MG/0.1 ML
INJECTION | OPHTHALMIC | Status: DC | PRN
  Administered 2016-01-01: 0.1 mL via INTRACAMERAL

## 2016-01-01 MED ORDER — EPINEPHRINE HCL 1 MG/ML IJ SOLN
INTRAOCULAR | Status: DC | PRN
  Administered 2016-01-01: 09:00:00 via OPHTHALMIC

## 2016-01-01 MED ORDER — NEOMYCIN-POLYMYXIN-DEXAMETH 3.5-10000-0.1 OP OINT
TOPICAL_OINTMENT | OPHTHALMIC | Status: AC
  Filled 2016-01-01: qty 3.5

## 2016-01-01 MED ORDER — POVIDONE-IODINE 5 % OP SOLN
OPHTHALMIC | Status: AC
  Administered 2016-01-01: 1 via OPHTHALMIC
  Filled 2016-01-01: qty 30

## 2016-01-01 MED ORDER — NA CHONDROIT SULF-NA HYALURON 40-30 MG/ML IO SOLN
INTRAOCULAR | Status: DC | PRN
  Administered 2016-01-01: 0.5 mL via INTRAOCULAR

## 2016-01-01 MED ORDER — MIDAZOLAM HCL 5 MG/5ML IJ SOLN
INTRAMUSCULAR | Status: DC | PRN
  Administered 2016-01-01: 1 mg via INTRAVENOUS

## 2016-01-01 MED ORDER — MOXIFLOXACIN HCL 0.5 % OP SOLN
OPHTHALMIC | Status: AC
  Filled 2016-01-01: qty 3

## 2016-01-01 MED ORDER — LIDOCAINE HCL (PF) 4 % IJ SOLN
INTRAMUSCULAR | Status: AC
  Filled 2016-01-01: qty 5

## 2016-01-01 SURGICAL SUPPLY — 22 items
CANNULA ANT/CHMB 27G (MISCELLANEOUS) ×1 IMPLANT
CANNULA ANT/CHMB 27GA (MISCELLANEOUS) ×3 IMPLANT
CUP MEDICINE 2OZ PLAST GRAD ST (MISCELLANEOUS) ×3 IMPLANT
GLOVE BIO SURGEON STRL SZ8 (GLOVE) ×3 IMPLANT
GLOVE BIOGEL M 6.5 STRL (GLOVE) ×3 IMPLANT
GLOVE SURG LX 7.5 STRW (GLOVE) ×2
GLOVE SURG LX STRL 7.5 STRW (GLOVE) ×1 IMPLANT
GOWN STRL REUS W/ TWL LRG LVL3 (GOWN DISPOSABLE) ×2 IMPLANT
GOWN STRL REUS W/TWL LRG LVL3 (GOWN DISPOSABLE) ×6
LENS IOL TECNIS ITEC 21.0 (Intraocular Lens) ×2 IMPLANT
PACK CATARACT (MISCELLANEOUS) ×3 IMPLANT
PACK CATARACT BRASINGTON LX (MISCELLANEOUS) ×3 IMPLANT
PACK EYE AFTER SURG (MISCELLANEOUS) ×3 IMPLANT
SOL BSS BAG (MISCELLANEOUS) ×3
SOL PREP PVP 2OZ (MISCELLANEOUS) ×3
SOLUTION BSS BAG (MISCELLANEOUS) ×1 IMPLANT
SOLUTION PREP PVP 2OZ (MISCELLANEOUS) ×1 IMPLANT
SYR 3ML LL SCALE MARK (SYRINGE) ×3 IMPLANT
SYR 5ML LL (SYRINGE) ×3 IMPLANT
SYR TB 1ML 27GX1/2 LL (SYRINGE) ×3 IMPLANT
WATER STERILE IRR 1000ML POUR (IV SOLUTION) ×3 IMPLANT
WIPE NON LINTING 3.25X3.25 (MISCELLANEOUS) ×3 IMPLANT

## 2016-01-01 NOTE — Transfer of Care (Signed)
Immediate Anesthesia Transfer of Care Note  Patient: Tammie Stone  Procedure(s) Performed: Procedure(s) with comments: CATARACT EXTRACTION PHACO AND INTRAOCULAR LENS PLACEMENT (IOC) (Left) - US 1.03 AP% 13.7 CDE 8.65 FLUID PACK LOT # [id number]  Patient Location: PACU  Anesthesia Type:MAC  Level of Consciousness: awake, alert  and oriented  Airway & Oxygen Therapy: Patient Spontanous Breathing  Post-op Assessment: Report given to RN  Post vital signs: Reviewed and stable  Last Vitals:  Filed Vitals:   01/01/16 0716  BP: 126/84  Pulse: 90  Temp: 36.8 C  Resp: 18    Last Pain: There were no vitals filed for this visit.       Complications: No apparent anesthesia complications

## 2016-01-01 NOTE — Anesthesia Postprocedure Evaluation (Signed)
Anesthesia Post Note  Patient: Vito Backersatricia H Szafran  Procedure(s) Performed: Procedure(s) (LRB): CATARACT EXTRACTION PHACO AND INTRAOCULAR LENS PLACEMENT (IOC) (Left)  Patient location during evaluation: Other Anesthesia Type: General Level of consciousness: awake and alert Pain management: pain level controlled Vital Signs Assessment: post-procedure vital signs reviewed and stable Respiratory status: spontaneous breathing, nonlabored ventilation, respiratory function stable and patient connected to nasal cannula oxygen Cardiovascular status: blood pressure returned to baseline and stable Postop Assessment: no signs of nausea or vomiting Anesthetic complications: no    Last Vitals:  Filed Vitals:   01/01/16 0903 01/01/16 0921  BP: 119/62 121/78  Pulse: 87 90  Temp: 36.4 C   Resp: 16 17    Last Pain:  Filed Vitals:   01/01/16 0923  PainSc: 3                  Jedi Catalfamo S

## 2016-01-01 NOTE — H&P (Signed)
  The History and Physical notes are on paper, have been signed, and are to be scanned. The patient remains stable and unchanged from the H&P.   Previous H&P reviewed, patient examined, and there are no changes.  Annamaria Salah 01/01/2016 8:12 AM

## 2016-01-01 NOTE — Anesthesia Preprocedure Evaluation (Addendum)
Anesthesia Evaluation  Patient identified by MRN, date of birth, ID band Patient awake    Reviewed: Allergy & Precautions, NPO status , Patient's Chart, lab work & pertinent test results, reviewed documented beta blocker date and time   History of Anesthesia Complications (+) PONV  Airway Mallampati: III  TM Distance: >3 FB     Dental  (+) Chipped   Pulmonary           Cardiovascular hypertension, Pt. on medications and Pt. on home beta blockers +CHF  + dysrhythmias      Neuro/Psych    GI/Hepatic GERD  Controlled,  Endo/Other  diabetes, Type 2Hypothyroidism Morbid obesity  Renal/GU      Musculoskeletal  (+) Arthritis ,   Abdominal   Peds  Hematology   Anesthesia Other Findings Gout.  Reproductive/Obstetrics                            Anesthesia Physical Anesthesia Plan  ASA: III  Anesthesia Plan: MAC   Post-op Pain Management:    Induction:   Airway Management Planned:   Additional Equipment:   Intra-op Plan:   Post-operative Plan:   Informed Consent: I have reviewed the patients History and Physical, chart, labs and discussed the procedure including the risks, benefits and alternatives for the proposed anesthesia with the patient or authorized representative who has indicated his/her understanding and acceptance.     Plan Discussed with: CRNA  Anesthesia Plan Comments:         Anesthesia Quick Evaluation

## 2016-01-01 NOTE — Op Note (Signed)
OPERATIVE NOTE  Vito Backersatricia H Djordjevic [id number] 01/01/2016   PREOPERATIVE DIAGNOSIS:  Nuclear sclerotic cataract left eye. H25.12   POSTOPERATIVE DIAGNOSIS:    Nuclear sclerotic cataract left eye.     PROCEDURE:  Phacoemusification with posterior chamber intraocular lens placement of the left eye   LENS:   Implant Name Type Inv. Item Serial No. Manufacturer Lot No. LRB No. Used  LENS IOL DIOP 21.0 - [id number] Intraocular Lens LENS IOL DIOP 21.0 [id number] AMO   Left 1        ULTRASOUND TIME: 14 % of 1 minutes, 3 seconds.  CDE 8.7   SURGEON:  Deirdre Evenerhadwick R. Limuel Nieblas, MD   ANESTHESIA:  Topical with tetracaine drops and 2% Xylocaine jelly, augmented with 1% preservative-free intracameral lidocaine.    COMPLICATIONS:  None.   DESCRIPTION OF PROCEDURE:  The patient was identified in the holding room and transported to the operating room and placed in the supine position under the operating microscope.  The left eye was identified as the operative eye and it was prepped and draped in the usual sterile ophthalmic fashion.   A 1 millimeter clear-corneal paracentesis was made at the 1:30 position. 0.5 ml of preservative-free 1% lidocaine was injected into the anterior chamber.  The anterior chamber was filled with Viscoat viscoelastic.  A 2.4 millimeter keratome was used to make a near-clear corneal incision at the 10:30 position.  .  A curvilinear capsulorrhexis was made with a cystotome and capsulorrhexis forceps.  Balanced salt solution was used to hydrodissect and hydrodelineate the nucleus.   Phacoemulsification was then used in stop and chop fashion to remove the lens nucleus and epinucleus.  The remaining cortex was then removed using the irrigation and aspiration handpiece. Provisc was then placed into the capsular bag to distend it for lens placement.  A lens was then injected into the capsular bag.  The remaining viscoelastic was aspirated.   Wounds were hydrated with balanced salt  solution.  The anterior chamber was inflated to a physiologic pressure with balanced salt solution. Cefuroxime 0.1 ml of a 10mg /ml solution was injected into the anterior chamber for a dose of 1 mg of intracameral antibiotic at the completion of the case.  Miostat was placed into the anterior chamber to constrict the pupil.  No wound leaks were noted.  Topical Vigamox drops and Maxitrol ointment were applied to the eye.  The patient was taken to the recovery room in stable condition without complications of anesthesia or surgery  Arber Wiemers 01/01/2016, 9:01 AM

## 2016-01-01 NOTE — Discharge Instructions (Signed)
Follow eye drop schedule ° °Cataract Surgery, Care After °Refer to this sheet in the next few weeks. These instructions provide you with information on caring for yourself after your procedure. Your caregiver may also give you more specific instructions. Your treatment has been planned according to current medical practices, but problems sometimes occur. Call your caregiver if you have any problems or questions after your procedure.  °HOME CARE INSTRUCTIONS  °· Avoid strenuous activities as directed by your caregiver. °· Ask your caregiver when you can resume driving. °· Use eyedrops or other medicines to help healing and control pressure inside your eye as directed by your caregiver. °· Only take over-the-counter or prescription medicines for pain, discomfort, or fever as directed by your caregiver. °· Do not to touch or rub your eyes. °· You may be instructed to use a protective shield during the first few days and nights after surgery. If not, wear sunglasses to protect your eyes. This is to protect the eye from pressure or from being accidentally bumped. °· Keep the area around your eye clean and dry. Avoid swimming or allowing water to hit you directly in the face while showering. Keep soap and shampoo out of your eyes. °· Do not bend or lift heavy objects. Bending increases pressure in the eye. You can walk, climb stairs, and do light household chores. °· Do not put a contact lens into the eye that had surgery until your caregiver says it is okay to do so. °· Ask your doctor when you can return to work. This will depend on the kind of work that you do. If you work in a dusty environment, you may be advised to wear protective eyewear for a period of time. °· Ask your caregiver when it will be safe to engage in sexual activity. °· Continue with your regular eye exams as directed by your caregiver. °What to expect: °· It is normal to feel itching and mild discomfort for a few days after cataract surgery. Some  fluid discharge is also common, and your eye may be sensitive to light and touch. °· After 1 to 2 days, even moderate discomfort should disappear. In most cases, healing will take about 6 weeks. °· If you received an intraocular lens (IOL), you may notice that colors are very bright or have a blue tinge. Also, if you have been in bright sunlight, everything may appear reddish for a few hours. If you see these color tinges, it is because your lens is clear and no longer cloudy. Within a few months after receiving an IOL, these extra colors should go away. When you have healed, you will probably need new glasses. °SEEK MEDICAL CARE IF:  °· You have increased bruising around your eye. °· You have discomfort not helped by medicine. °SEEK IMMEDIATE MEDICAL CARE IF:  °· You have a  fever. °· You have a worsening or sudden vision loss. °· You have redness, swelling, or increasing pain in the eye. °· You have a thick discharge from the eye that had surgery. °MAKE SURE YOU: °· Understand these instructions. °· Will watch your condition. °· Will get help right away if you are not doing well or get worse. °  °This information is not intended to replace advice given to you by your health care provider. Make sure you discuss any questions you have with your health care provider. °  °Document Released: 02/01/2005 Document Revised: 08/05/2014 Document Reviewed: 03/08/2011 °Elsevier Interactive Patient Education ©2016 [employer] ° °

## 2016-01-24 ENCOUNTER — Ambulatory Visit
Admission: RE | Admit: 2016-01-24 | Discharge: 2016-01-24 | Disposition: A | Discharge status: Home / Self Care | Payer: Self-pay | Source: Ambulatory Visit | Attending: Internal Medicine | Admitting: Internal Medicine

## 2016-01-24 ENCOUNTER — Ambulatory Visit: Payer: Self-pay | Attending: Internal Medicine | Admitting: *Deleted

## 2016-01-24 ENCOUNTER — Encounter: Payer: Self-pay | Admitting: *Deleted

## 2016-01-24 VITALS — BP 121/85 | HR 112 | Temp 98.1°F | Resp 16 | Ht 62.99 in | Wt 263.9 lb

## 2016-01-24 DIAGNOSIS — Z0000 Encounter for general adult medical examination without abnormal findings: Secondary | ICD-10-CM

## 2016-01-24 NOTE — Patient Instructions (Signed)
Gave patient hand-out, Women Staying Healthy, Active and Well from [facility], with education on breast health, pap smears, heart and colon health. 

## 2016-01-24 NOTE — Progress Notes (Signed)
Subjective:     Patient ID: Vito Backersatricia H Bushong, female   DOB: 1950/12/01, 65 y.o.   MRN: [id number]  HPI   Review of Systems     Objective:   Physical Exam  Pulmonary/Chest: Right breast exhibits no inverted nipple, no mass, no nipple discharge, no skin change and no tenderness. Left breast exhibits no inverted nipple, no mass, no nipple discharge, no skin change and no tenderness. Breasts are asymmetrical.    Left breast larger than the right breast       Assessment:     65 year old White [facility].  Clinical breast exam reveals excoriated skin under bilateral breast from sweating per patient.  Reviewed to ways to keep skin dry and cool.  If it worsens she is to follow up with her primary care provider Dr. Sallee LangeSoles for an antifungal treatment.  She is agreeable.  Taught self breast awareness. Patient has been screened for eligibility.  She does not have any insurance, Medicare or Medicaid.  She also meets financial eligibility.  Hand-out given on the Affordable Care Act.    Plan:     Screening mammogram ordered.  Will follow up per [facility] protocol.

## 2016-02-08 ENCOUNTER — Encounter: Payer: Self-pay | Admitting: *Deleted

## 2016-02-08 NOTE — Progress Notes (Signed)
Letter mailed from the [facility] to inform patient of her normal mammogram results.  Patient is to follow-up with annual screening in one year.  HSIS to Christy. 

## 2017-02-11 ENCOUNTER — Emergency Department (HOSPITAL_COMMUNITY): Payer: Medicaid Other

## 2017-02-11 ENCOUNTER — Inpatient Hospital Stay (HOSPITAL_COMMUNITY)
Admission: EM | Admit: 2017-02-11 | Discharge: 2017-02-26 | DRG: 308 | Disposition: E | Payer: Medicaid Other | Attending: Emergency Medicine | Admitting: Emergency Medicine

## 2017-02-11 ENCOUNTER — Inpatient Hospital Stay (HOSPITAL_COMMUNITY): Payer: Medicaid Other

## 2017-02-11 ENCOUNTER — Encounter (HOSPITAL_COMMUNITY): Payer: Self-pay | Admitting: Emergency Medicine

## 2017-02-11 DIAGNOSIS — I4901 Ventricular fibrillation: Principal | ICD-10-CM | POA: Diagnosis present

## 2017-02-11 DIAGNOSIS — I4891 Unspecified atrial fibrillation: Secondary | ICD-10-CM

## 2017-02-11 DIAGNOSIS — Z961 Presence of intraocular lens: Secondary | ICD-10-CM | POA: Diagnosis present

## 2017-02-11 DIAGNOSIS — G9340 Encephalopathy, unspecified: Secondary | ICD-10-CM | POA: Diagnosis not present

## 2017-02-11 DIAGNOSIS — Z9289 Personal history of other medical treatment: Secondary | ICD-10-CM | POA: Diagnosis not present

## 2017-02-11 DIAGNOSIS — Z6841 Body Mass Index (BMI) 40.0 and over, adult: Secondary | ICD-10-CM | POA: Diagnosis not present

## 2017-02-11 DIAGNOSIS — Z9049 Acquired absence of other specified parts of digestive tract: Secondary | ICD-10-CM

## 2017-02-11 DIAGNOSIS — N170 Acute kidney failure with tubular necrosis: Secondary | ICD-10-CM | POA: Diagnosis not present

## 2017-02-11 DIAGNOSIS — Z791 Long term (current) use of non-steroidal anti-inflammatories (NSAID): Secondary | ICD-10-CM

## 2017-02-11 DIAGNOSIS — E039 Hypothyroidism, unspecified: Secondary | ICD-10-CM | POA: Diagnosis present

## 2017-02-11 DIAGNOSIS — R402312 Coma scale, best motor response, none, at arrival to emergency department: Secondary | ICD-10-CM | POA: Diagnosis present

## 2017-02-11 DIAGNOSIS — K7201 Acute and subacute hepatic failure with coma: Secondary | ICD-10-CM | POA: Diagnosis not present

## 2017-02-11 DIAGNOSIS — R570 Cardiogenic shock: Secondary | ICD-10-CM | POA: Diagnosis present

## 2017-02-11 DIAGNOSIS — G931 Anoxic brain damage, not elsewhere classified: Secondary | ICD-10-CM | POA: Diagnosis not present

## 2017-02-11 DIAGNOSIS — I4510 Unspecified right bundle-branch block: Secondary | ICD-10-CM | POA: Diagnosis present

## 2017-02-11 DIAGNOSIS — Z885 Allergy status to narcotic agent status: Secondary | ICD-10-CM

## 2017-02-11 DIAGNOSIS — M109 Gout, unspecified: Secondary | ICD-10-CM | POA: Diagnosis present

## 2017-02-11 DIAGNOSIS — J810 Acute pulmonary edema: Secondary | ICD-10-CM | POA: Diagnosis not present

## 2017-02-11 DIAGNOSIS — K219 Gastro-esophageal reflux disease without esophagitis: Secondary | ICD-10-CM | POA: Diagnosis present

## 2017-02-11 DIAGNOSIS — Z8249 Family history of ischemic heart disease and other diseases of the circulatory system: Secondary | ICD-10-CM

## 2017-02-11 DIAGNOSIS — Z9842 Cataract extraction status, left eye: Secondary | ICD-10-CM

## 2017-02-11 DIAGNOSIS — J9600 Acute respiratory failure, unspecified whether with hypoxia or hypercapnia: Secondary | ICD-10-CM

## 2017-02-11 DIAGNOSIS — J811 Chronic pulmonary edema: Secondary | ICD-10-CM

## 2017-02-11 DIAGNOSIS — K920 Hematemesis: Secondary | ICD-10-CM | POA: Diagnosis present

## 2017-02-11 DIAGNOSIS — M1990 Unspecified osteoarthritis, unspecified site: Secondary | ICD-10-CM | POA: Diagnosis present

## 2017-02-11 DIAGNOSIS — E872 Acidosis: Secondary | ICD-10-CM | POA: Diagnosis present

## 2017-02-11 DIAGNOSIS — R402212 Coma scale, best verbal response, none, at arrival to emergency department: Secondary | ICD-10-CM | POA: Diagnosis present

## 2017-02-11 DIAGNOSIS — E785 Hyperlipidemia, unspecified: Secondary | ICD-10-CM | POA: Diagnosis present

## 2017-02-11 DIAGNOSIS — R402112 Coma scale, eyes open, never, at arrival to emergency department: Secondary | ICD-10-CM | POA: Diagnosis present

## 2017-02-11 DIAGNOSIS — I481 Persistent atrial fibrillation: Secondary | ICD-10-CM | POA: Diagnosis present

## 2017-02-11 DIAGNOSIS — E1165 Type 2 diabetes mellitus with hyperglycemia: Secondary | ICD-10-CM | POA: Diagnosis present

## 2017-02-11 DIAGNOSIS — I5032 Chronic diastolic (congestive) heart failure: Secondary | ICD-10-CM | POA: Diagnosis present

## 2017-02-11 DIAGNOSIS — Z9841 Cataract extraction status, right eye: Secondary | ICD-10-CM | POA: Diagnosis not present

## 2017-02-11 DIAGNOSIS — I110 Hypertensive heart disease with heart failure: Secondary | ICD-10-CM | POA: Diagnosis present

## 2017-02-11 DIAGNOSIS — Z90710 Acquired absence of both cervix and uterus: Secondary | ICD-10-CM | POA: Diagnosis not present

## 2017-02-11 DIAGNOSIS — R579 Shock, unspecified: Secondary | ICD-10-CM | POA: Diagnosis not present

## 2017-02-11 DIAGNOSIS — J9601 Acute respiratory failure with hypoxia: Secondary | ICD-10-CM | POA: Diagnosis present

## 2017-02-11 DIAGNOSIS — I469 Cardiac arrest, cause unspecified: Secondary | ICD-10-CM | POA: Diagnosis present

## 2017-02-11 DIAGNOSIS — Z66 Do not resuscitate: Secondary | ICD-10-CM | POA: Diagnosis not present

## 2017-02-11 DIAGNOSIS — Z882 Allergy status to sulfonamides status: Secondary | ICD-10-CM

## 2017-02-11 DIAGNOSIS — Z794 Long term (current) use of insulin: Secondary | ICD-10-CM

## 2017-02-11 DIAGNOSIS — Z7901 Long term (current) use of anticoagulants: Secondary | ICD-10-CM

## 2017-02-11 DIAGNOSIS — Z515 Encounter for palliative care: Secondary | ICD-10-CM | POA: Diagnosis not present

## 2017-02-11 LAB — POCT I-STAT 3, ART BLOOD GAS (G3+)
ACID-BASE DEFICIT: 8 mmol/L — AB (ref 0.0–2.0)
Bicarbonate: 18.3 mmol/L — ABNORMAL LOW (ref 20.0–28.0)
O2 SAT: 96 %
PCO2 ART: 43.4 mmHg (ref 32.0–48.0)
PH ART: 7.239 — AB (ref 7.350–7.450)
TCO2: 20 mmol/L (ref 0–100)
pO2, Arterial: 97 mmHg (ref 83.0–108.0)

## 2017-02-11 LAB — CBC WITH DIFFERENTIAL/PLATELET
BAND NEUTROPHILS: 0 %
BASOS PCT: 0 %
Basophils Absolute: 0 10*3/uL (ref 0.0–0.1)
Blasts: 0 %
EOS ABS: 0 10*3/uL (ref 0.0–0.7)
Eosinophils Relative: 0 %
HEMATOCRIT: 44.8 % (ref 36.0–46.0)
Hemoglobin: 14.1 g/dL (ref 12.0–15.0)
LYMPHS PCT: 23 %
Lymphs Abs: 11.4 10*3/uL — ABNORMAL HIGH (ref 0.7–4.0)
MCH: 32.9 pg (ref 26.0–34.0)
MCHC: 31.5 g/dL (ref 30.0–36.0)
MCV: 104.4 fL — ABNORMAL HIGH (ref 78.0–100.0)
MONO ABS: 2.5 10*3/uL — AB (ref 0.1–1.0)
MONOS PCT: 5 %
Metamyelocytes Relative: 0 %
Myelocytes: 0 %
NEUTROS ABS: 35.8 10*3/uL — AB (ref 1.7–7.7)
NEUTROS PCT: 72 %
NRBC: 0 /100{WBCs}
OTHER: 0 %
Platelets: 224 10*3/uL (ref 150–400)
Promyelocytes Absolute: 0 %
RBC: 4.29 MIL/uL (ref 3.87–5.11)
RDW: 14.1 % (ref 11.5–15.5)
WBC Morphology: INCREASED
WBC: 49.7 10*3/uL — ABNORMAL HIGH (ref 4.0–10.5)

## 2017-02-11 LAB — I-STAT CHEM 8, ED
BUN: 27 mg/dL — AB (ref 6–20)
CREATININE: 0.9 mg/dL (ref 0.44–1.00)
Calcium, Ion: 0.91 mmol/L — ABNORMAL LOW (ref 1.15–1.40)
Chloride: 104 mmol/L (ref 101–111)
Glucose, Bld: 354 mg/dL — ABNORMAL HIGH (ref 65–99)
HEMATOCRIT: 43 % (ref 36.0–46.0)
HEMOGLOBIN: 14.6 g/dL (ref 12.0–15.0)
Potassium: 4.1 mmol/L (ref 3.5–5.1)
Sodium: 136 mmol/L (ref 135–145)
TCO2: 17 mmol/L (ref 0–100)

## 2017-02-11 LAB — URINALYSIS, ROUTINE W REFLEX MICROSCOPIC
Bilirubin Urine: NEGATIVE
Glucose, UA: 50 mg/dL — AB
KETONES UR: NEGATIVE mg/dL
LEUKOCYTES UA: NEGATIVE
Nitrite: NEGATIVE
PROTEIN: 30 mg/dL — AB
Specific Gravity, Urine: 1.008 (ref 1.005–1.030)
pH: 5 (ref 5.0–8.0)

## 2017-02-11 LAB — COMPREHENSIVE METABOLIC PANEL
ALK PHOS: 121 U/L (ref 38–126)
ALT: 145 U/L — ABNORMAL HIGH (ref 14–54)
ANION GAP: 20 — AB (ref 5–15)
AST: 256 U/L — ABNORMAL HIGH (ref 15–41)
Albumin: 3.1 g/dL — ABNORMAL LOW (ref 3.5–5.0)
BUN: 20 mg/dL (ref 6–20)
CALCIUM: 8.4 mg/dL — AB (ref 8.9–10.3)
CO2: 18 mmol/L — AB (ref 22–32)
Chloride: 99 mmol/L — ABNORMAL LOW (ref 101–111)
Creatinine, Ser: 1.32 mg/dL — ABNORMAL HIGH (ref 0.44–1.00)
GFR calc non Af Amer: 42 mL/min — ABNORMAL LOW (ref >60)
GFR, EST AFRICAN AMERICAN: 49 mL/min — AB (ref >60)
Glucose, Bld: 393 mg/dL — ABNORMAL HIGH (ref 65–99)
POTASSIUM: 4.1 mmol/L (ref 3.5–5.1)
SODIUM: 137 mmol/L (ref 135–145)
Total Bilirubin: 0.7 mg/dL (ref 0.3–1.2)
Total Protein: 6.1 g/dL — ABNORMAL LOW (ref 6.5–8.1)

## 2017-02-11 LAB — I-STAT ARTERIAL BLOOD GAS, ED
ACID-BASE DEFICIT: 15 mmol/L — AB (ref 0.0–2.0)
BICARBONATE: 13.9 mmol/L — AB (ref 20.0–28.0)
O2 SAT: 97 %
PO2 ART: 113 mmHg — AB (ref 83.0–108.0)
Patient temperature: 98.6
TCO2: 15 mmol/L (ref 0–100)
pCO2 arterial: 41.4 mmHg (ref 32.0–48.0)
pH, Arterial: 7.133 — CL (ref 7.350–7.450)

## 2017-02-11 LAB — HEPARIN LEVEL (UNFRACTIONATED): Heparin Unfractionated: 1.36 IU/mL — ABNORMAL HIGH (ref 0.30–0.70)

## 2017-02-11 LAB — URINALYSIS, MICROSCOPIC (REFLEX)

## 2017-02-11 LAB — RAPID URINE DRUG SCREEN, HOSP PERFORMED
Amphetamines: NOT DETECTED
BARBITURATES: NOT DETECTED
BENZODIAZEPINES: NOT DETECTED
Cocaine: NOT DETECTED
Opiates: NOT DETECTED
Tetrahydrocannabinol: NOT DETECTED

## 2017-02-11 LAB — I-STAT CG4 LACTIC ACID, ED: Lactic Acid, Venous: 10.23 mmol/L (ref 0.5–1.9)

## 2017-02-11 LAB — TROPONIN I: TROPONIN I: 0.8 ng/mL — AB (ref <0.03)

## 2017-02-11 LAB — GLUCOSE, CAPILLARY
GLUCOSE-CAPILLARY: 334 mg/dL — AB (ref 65–99)
GLUCOSE-CAPILLARY: 301 mg/dL — AB (ref 65–99)
GLUCOSE-CAPILLARY: 296 mg/dL — AB (ref 65–99)

## 2017-02-11 LAB — LACTIC ACID, PLASMA: Lactic Acid, Venous: 5.3 mmol/L (ref 0.5–1.9)

## 2017-02-11 LAB — APTT: aPTT: 36 seconds (ref 24–36)

## 2017-02-11 LAB — ETHANOL: Alcohol, Ethyl (B): <5 mg/dL (ref <5)

## 2017-02-11 MED ORDER — AMPICILLIN-SULBACTAM SODIUM 3 (2-1) G IJ SOLR
3.0000 g | Freq: Four times a day (QID) | INTRAMUSCULAR | Status: DC
  Administered 2017-02-11 – 2017-02-12 (×3): 3 g via INTRAVENOUS
  Filled 2017-02-11 (×4): qty 3

## 2017-02-11 MED ORDER — SODIUM CHLORIDE 0.9 % IV SOLN
INTRAVENOUS | Status: DC
  Administered 2017-02-11: 2.7 [IU]/h via INTRAVENOUS
  Filled 2017-02-11 (×2): qty 1

## 2017-02-11 MED ORDER — MIDAZOLAM HCL 2 MG/2ML IJ SOLN
INTRAMUSCULAR | Status: AC
  Filled 2017-02-11: qty 2

## 2017-02-11 MED ORDER — AMIODARONE HCL IN DEXTROSE 360-4.14 MG/200ML-% IV SOLN
INTRAVENOUS | Status: AC
  Filled 2017-02-11: qty 200

## 2017-02-11 MED ORDER — AMIODARONE HCL IN DEXTROSE 360-4.14 MG/200ML-% IV SOLN
30.0000 mg/h | INTRAVENOUS | Status: DC
  Administered 2017-02-12 – 2017-02-13 (×4): 30 mg/h via INTRAVENOUS
  Filled 2017-02-11 (×3): qty 200

## 2017-02-11 MED ORDER — ORAL CARE MOUTH RINSE
15.0000 mL | Freq: Four times a day (QID) | OROMUCOSAL | Status: DC
  Administered 2017-02-11 – 2017-02-12 (×2): 15 mL via OROMUCOSAL

## 2017-02-11 MED ORDER — PANTOPRAZOLE SODIUM 40 MG IV SOLR
40.0000 mg | INTRAVENOUS | Status: DC
  Administered 2017-02-11 – 2017-02-12 (×2): 40 mg via INTRAVENOUS
  Filled 2017-02-11 (×2): qty 40

## 2017-02-11 MED ORDER — FENTANYL CITRATE (PF) 100 MCG/2ML IJ SOLN
100.0000 ug | INTRAMUSCULAR | Status: DC | PRN
  Administered 2017-02-11: 25 ug via INTRAVENOUS
  Filled 2017-02-11: qty 2

## 2017-02-11 MED ORDER — CHLORHEXIDINE GLUCONATE 0.12% ORAL RINSE (MEDLINE KIT)
15.0000 mL | Freq: Two times a day (BID) | OROMUCOSAL | Status: DC
  Administered 2017-02-11 – 2017-02-12 (×3): 15 mL via OROMUCOSAL

## 2017-02-11 MED ORDER — SODIUM BICARBONATE 8.4 % IV SOLN
INTRAVENOUS | Status: AC
  Administered 2017-02-11: 50 meq
  Filled 2017-02-11: qty 150

## 2017-02-11 MED ORDER — HEPARIN (PORCINE) IN NACL 100-0.45 UNIT/ML-% IJ SOLN: 1200.0000 [IU]/h | INTRAMUSCULAR | Status: DC

## 2017-02-11 MED ORDER — SODIUM BICARBONATE 8.4 % IV SOLN
INTRAVENOUS | Status: AC
  Administered 2017-02-11: 50 meq
  Filled 2017-02-11: qty 100

## 2017-02-11 MED ORDER — MIDAZOLAM HCL 2 MG/2ML IJ SOLN
2.0000 mg | INTRAMUSCULAR | Status: DC | PRN
  Administered 2017-02-11: 2 mg via INTRAVENOUS
  Filled 2017-02-11 (×2): qty 2

## 2017-02-11 MED ORDER — STERILE WATER FOR INJECTION IV SOLN
INTRAVENOUS | Status: DC
  Administered 2017-02-11 – 2017-02-12 (×3): via INTRAVENOUS
  Filled 2017-02-11 (×7): qty 850

## 2017-02-11 MED ORDER — EPINEPHRINE PF 1 MG/10ML IJ SOSY
PREFILLED_SYRINGE | INTRAMUSCULAR | Status: DC | PRN
  Administered 2017-02-11 (×2): 1 mg via INTRAVENOUS

## 2017-02-11 MED ORDER — AMIODARONE HCL IN DEXTROSE 360-4.14 MG/200ML-% IV SOLN
60.0000 mg/h | INTRAVENOUS | Status: AC
  Administered 2017-02-11: 60 mg/h via INTRAVENOUS
  Filled 2017-02-11 (×2): qty 200

## 2017-02-11 MED ORDER — VASOPRESSIN 20 UNIT/ML IV SOLN
0.0300 [IU]/min | INTRAVENOUS | Status: DC
  Administered 2017-02-11 – 2017-02-12 (×2): 0.03 [IU]/min via INTRAVENOUS
  Filled 2017-02-11 (×3): qty 2

## 2017-02-11 MED ORDER — FENTANYL CITRATE (PF) 100 MCG/2ML IJ SOLN: 100.0000 ug | INTRAMUSCULAR | Status: DC | PRN

## 2017-02-11 MED ORDER — SODIUM BICARBONATE 8.4 % IV SOLN
INTRAVENOUS | Status: DC | PRN
  Administered 2017-02-11: 50 meq via INTRAVENOUS

## 2017-02-11 MED ORDER — PANTOPRAZOLE SODIUM 40 MG PO PACK
40.0000 mg | PACK | Freq: Every day | ORAL | Status: DC
  Filled 2017-02-11: qty 20

## 2017-02-11 MED ORDER — NOREPINEPHRINE BITARTRATE 1 MG/ML IV SOLN
0.0000 ug/min | INTRAVENOUS | Status: DC
  Administered 2017-02-11: 20 ug/min via INTRAVENOUS
  Filled 2017-02-11 (×2): qty 4

## 2017-02-11 MED ORDER — MIDAZOLAM HCL 2 MG/2ML IJ SOLN
2.0000 mg | INTRAMUSCULAR | Status: DC | PRN
  Administered 2017-02-11: 2 mg via INTRAVENOUS

## 2017-02-11 MED ORDER — NOREPINEPHRINE BITARTRATE 1 MG/ML IV SOLN
0.0000 ug/min | INTRAVENOUS | Status: DC
  Administered 2017-02-11: 15 ug/min via INTRAVENOUS
  Administered 2017-02-12: 33 ug/min via INTRAVENOUS
  Administered 2017-02-12: 40 ug/min via INTRAVENOUS
  Administered 2017-02-12: 33 ug/min via INTRAVENOUS
  Administered 2017-02-13: 20 ug/min via INTRAVENOUS
  Filled 2017-02-11 (×5): qty 16

## 2017-02-11 MED ORDER — SODIUM CHLORIDE 0.9 % IV SOLN: 250.0000 mL | INTRAVENOUS | Status: DC | PRN

## 2017-02-11 MED ORDER — INSULIN ASPART 100 UNIT/ML ~~LOC~~ SOLN: 2.0000 [IU] | SUBCUTANEOUS | Status: DC

## 2017-02-11 NOTE — Procedures (Signed)
Intubation Procedure Note Tammie Stone [id number] [birth year]  Procedure: Intubation Indications: Respiratory insufficiency  Procedure Details Consent: Unable to obtain consent because of emergent medical necessity. Time Out: Verified patient identification, verified procedure, site/side was marked, verified correct patient position, special equipment/implants available, medications/allergies/relevent history reviewed, required imaging and test results available.  Performed  Maximum sterile technique was used including gloves, gown, hand hygiene and mask.  MAC and 4    Evaluation Hemodynamic Status: BP stable throughout; O2 sats: stable throughout Patient's Current Condition: stable Complications: No apparent complications Patient did tolerate procedure well. Chest X-ray ordered to verify placement.  CXR: pending.  Pt from EMS with [facility] airway in place during CPR. Upon ROSC, King airway removed and pt intubated with 7.5 ett using glidescope #4 blade with 1 attempt. Positive color change noted on etco2, bilateral BS throughout, direct visualization, CXR pending. ETT secured at 23 at the lip. RT will continue to monitor.    Jesse Sans 01/26/2017

## 2017-02-11 NOTE — ED Notes (Signed)
Pt remains unresponsive.  Opening eyes for brief second then closing again.

## 2017-02-11 NOTE — ED Notes (Signed)
Dr. Delton CoombesByrum in to assess pt,.

## 2017-02-11 NOTE — ED Notes (Signed)
Joneen RoachPaul Hoffman, PA with critical care in to assess pt at this time

## 2017-02-11 NOTE — ED Notes (Signed)
Pt to ED via [facility] with CPR in progress.  Pt was found in a car pulseless and apneic.  EMS gave pt Epi x's 8. And amiodarone 450mg .  On arrival pt remains unresponsive with CPR in progress

## 2017-02-11 NOTE — H&P (Signed)
PULMONARY / CRITICAL CARE MEDICINE   Name: Tammie Stone MRN: [id number] DOB: August 21, 1950    ADMISSION DATE:  01/29/2017 CONSULTATION DATE:  02/21/2017  REFERRING MD:  Dr. Rubin Payor   CHIEF COMPLAINT:  Cardiac Arrest   HISTORY OF PRESENT ILLNESS:   66 y/o F admitted 7/17 after cardiac arrest.    The patient presented to [facility] on 7/17 after VF arrest. She was found pulseless in her car. EMS initial rhythm was VT/VF, shocked to PEA. CPR in progress on arrival to [facility]. Duration before [facility] approximately 1 hour. King airway was changed to ETT.   PAST MEDICAL HISTORY :  She  has a past medical history of Arthritis; CHF (congestive heart failure) (HCC); Diabetes mellitus without complication (HCC); Dysrhythmia; Edema; GERD (gastroesophageal reflux disease); Gout; Hypothyroidism; Palpitations; and PONV (postoperative nausea and vomiting).  PAST SURGICAL HISTORY: She  has a past surgical history that includes Appendectomy; Cholecystectomy; Abdominal hysterectomy; Cataract extraction w/PHACO (Right, 03/30/2015); Nasal sinus surgery; Eye surgery; and Cataract extraction w/PHACO (Left, 01/01/2016).  Allergies  Allergen Reactions  . Codeine   . Sulfa Antibiotics     No current facility-administered medications on file prior to encounter.    Current Outpatient Prescriptions on File Prior to Encounter  Medication Sig  . allopurinol (ZYLOPRIM) 100 MG tablet Take 100 mg by mouth 2 (two) times daily.   Marland Kitchen apixaban (ELIQUIS) 5 MG TABS tablet Take 5 mg by mouth 2 (two) times daily.  . cetirizine (ZYRTEC) 10 MG tablet Take 10 mg by mouth daily.  Marland Kitchen diltiazem (DILACOR XR) 180 MG 24 hr capsule Take 180 mg by mouth daily.  . furosemide (LASIX) 40 MG tablet Take 40 mg by mouth daily.   . insulin detemir (LEVEMIR) 100 UNIT/ML injection Inject 70 Units into the skin at bedtime.  Marland Kitchen levothyroxine (SYNTHROID, LEVOTHROID) 112 MCG tablet Take 112 mcg by mouth daily before breakfast.  . meloxicam (MOBIC) 7.5 MG tablet  Take 7.5 mg by mouth daily. Reported on 01/01/2016  . metoprolol succinate (TOPROL-XL) 50 MG 24 hr tablet Take 100 mg by mouth daily. Take with or immediately following a meal.  . potassium chloride SA (K-DUR,KLOR-CON) 20 MEQ tablet Take 20 mEq by mouth daily.  . pravastatin (PRAVACHOL) 80 MG tablet Take 80 mg by mouth daily.  . ranitidine (ZANTAC) 150 MG tablet Take 150 mg by mouth at bedtime.  Marland Kitchen SAXagliptin-MetFORMIN (KOMBIGLYZE XR PO) Take 500 mg by mouth daily.    FAMILY HISTORY:  Her has no family status information on file.    SOCIAL HISTORY: She  reports that she has never smoked. She has never used smokeless tobacco. She reports that she does not drink alcohol or use drugs.  REVIEW OF SYSTEMS:  Unable to complete as patient is altered on vent.   SUBJECTIVE:  Unresponsive Currently on norepi 25  VITAL SIGNS: BP 113/81   Pulse (!) 143   Resp (!) 29   Ht 5' 5.5" (1.664 m)   LMP 01/23/1998 (Approximate)   SpO2 91%   HEMODYNAMICS:    VENTILATOR SETTINGS: Vent Mode: PRVC FiO2 (%):  [100 %] 100 % Set Rate:  [15 bmp-24 bmp] 24 bmp Vt Set:  [470 mL-570 mL] 470 mL PEEP:  [5 cmH20-8 cmH20] 8 cmH20 Plateau Pressure:  [16 cmH20] 16 cmH20  INTAKE / OUTPUT: No intake/output data recorded.  PHYSICAL EXAMINATION: General:  Ill appearing obese woman on MV HEENT: MM pink/moist, ETT in place with frothy secretions  PSY: unresponsive. Opened eyes when suctioned.  Not following any commands.  Neuro: pupils sluggish but react, no response to verbal or stim CV: irregularly irregular  PULM: even/non-labored, lungs bilaterally very coarse ZH:YQMV, non-tender, bsx4 active  Extremities: warm/dry, trace pretibial edema edema  Skin: no rashes or lesions   LABS:  BMET  Recent Labs Lab 2017/03/03 1621  NA 136  K 4.1  CL 104  BUN 27*  CREATININE 0.90  GLUCOSE 354*    Electrolytes No results for input(s): CALCIUM, MG, PHOS in the last 168 hours.  CBC  Recent Labs Lab  03/03/17 1621 03/03/2017 1634  WBC  --  49.7*  HGB 14.6 14.1  HCT 43.0 44.8  PLT  --  224    Coag's No results for input(s): APTT, INR in the last 168 hours.  Sepsis Markers  Recent Labs Lab 2017/03/03 1649  LATICACIDVEN 10.23*    ABG No results for input(s): PHART, PCO2ART, PO2ART in the last 168 hours.  Liver Enzymes No results for input(s): AST, ALT, ALKPHOS, BILITOT, ALBUMIN in the last 168 hours.  Cardiac Enzymes No results for input(s): TROPONINI, PROBNP in the last 168 hours.  Glucose No results for input(s): GLUCAP in the last 168 hours.  Imaging Dg Chest Portable 1 View  Result Date: 03/03/17 CLINICAL DATA:  Found unresponsive in vehicle.  Status post CPR. EXAM: PORTABLE CHEST 1 VIEW COMPARISON:  None. FINDINGS: ET tube tip is above the carina. There is a nasogastric tube with tip in the stomach. Heart size is normal. No pleural effusion or edema. Lungs are clear. IMPRESSION: 1. Appropriately positioned support apparatus as above. 2. No focal cardiopulmonary abnormalities noted. Electronically Signed   By: Signa Kell M.D.   On: 03/03/17 16:39     STUDIES:  CT Head 7/17 >>  UDS 7/17 >>   CULTURES: BCx2 7/17 >>  UA 7/17 >>  UC 7/17 >> Sputum 7/17>>  ANTIBIOTICS: Unasyn 7/17 >>   SIGNIFICANT EVENTS: 7/17  Admit post arrest, >1 hour downhtime  LINES/TUBES: ETT 7/17 >> CVC 7/17 >>  Femoral art line 7/17 >>   DISCUSSION: 66 y/o F admitted 7/17 after prolonged cardiac arrest.  Initial rhythm VF, >1 hour downtime.    ASSESSMENT / PLAN:  PULMONARY A: Acute Respiratory Failure in the setting of Cardiac Arrest  At risk aspiration PNA / pneumonitis P:   PRVC 8 cc/kg  Increase PEEP to 8 Increase rate to 24 with acidosis  Follow up ABG in 2 hours and in am  Trend CXR   CARDIOVASCULAR A:  VF Arrest - prolonged downtime  Shock, likely cardiogenic Hx of CHF  P:  Admit to ICU  Not a hypothermia candidate due to down time. Will avoid  fevers Heparin gtt as long as head Ct negative Start amiodarone gtt now Cardiology consulted, appreciate input  Levophed for MAP > 65 Place CVC and art line   RENAL A:   Metabolic Acidosis - lactic post arrest  P:   Trend BMP / urinary output Replace electrolytes as indicated Avoid nephrotoxic agents, ensure adequate renal perfusion  GASTROINTESTINAL A:   GERD  Morbid Obesity Coffee Ground Drainage from NGT P:   NPO / OGT Consider TF in am PPI for SUP  HEMATOLOGIC A:   Mild Leukocytosis - suspect stress response  P:  Trend CBC  Hold Eliquis  Heparin gtt per Pharmacy, to start if head CT scan negative Monitor for bleeding with coffee ground material in NGT   INFECTIOUS A:   Leukocytosis At risk aspiration  PNA P:   Trend WBC Start empiric unasyn and follow CXR, d/c if no infiltrates evolve  ENDOCRINE A:   Hyperglycemia P:   SSI per protocol  NEUROLOGIC A:   Acute Encephalopathy - post arrest, r/o anoxia  P:   RASS goal: 0 to -1  CT Head now Assess UDS PRN fentanyl / versed for sedation  At high risk for anoxic injury. Based on discussions with family we will attempt to stabilize, support fully to give time to see neuro fxn post CPR.    FAMILY  - Updates: Updated at bedside per Henreitta LeberP Hoffman, NP. Family is shocked with these events. They understand that she is tenuous, may have a devastating injury. While they would not want to continue interventions if there were "nothing else to do," they are not ready to change code status, place limitations on her care at this time. Will need to continue these conversations.     - Inter-disciplinary family meet or Palliative Care meeting due by: 02/18/17  Independent CC time 60 minutes.   Levy Pupaobert Nimsi Males, MD, PhD 02/19/2017, 5:38 PM [facility] [phone] or if no answer [phone]

## 2017-02-11 NOTE — ED Provider Notes (Signed)
[facility] DEPT Provider Note   CSN: [id number] Arrival date & time: 02/22/2017  1557     History   Chief Complaint No chief complaint on file.  Level 5 caveat due to CPR and unresponsiveness. HPI Tammie Stone is a 66 y.o. female.  HPI Patient brought in in CPR. Around 45 minutes prior to arrival was waiting to pick her son off from work. After discussion with son had probably 5 minutes of down time potentially. She was found by her son unresponsive and without a pulse. CPR was started. Upon first responder arrival patient was found to be in v fib. Defibrillated into a PDA. Had a total of 8 or 9 mg of epinephrine. Had CPR continued upon arrival to the ER. Had no return of vitals without the CPR. Given epinephrine here and had return of vitals. Had [facility] airway placed by EMS. Reportedly had no complaints prior to the rest. History of atrial fibrillation.   Past Medical History:  Diagnosis Date  . Arthritis   . CHF (congestive heart failure) ([place])   . Diabetes mellitus without complication ([place])   . Dysrhythmia    afib  . Edema    feet  . GERD (gastroesophageal reflux disease)   . Gout   . Hypothyroidism   . Palpitations    rare  . PONV (postoperative nausea and vomiting)     Patient Active Problem List   Diagnosis Date Noted  . Cardiac arrest ([place]) 02/07/2017    Past Surgical History:  Procedure Laterality Date  . ABDOMINAL HYSTERECTOMY    . APPENDECTOMY    . CATARACT EXTRACTION W/PHACO Right 03/30/2015   Procedure: CATARACT EXTRACTION PHACO AND INTRAOCULAR LENS PLACEMENT (IOC);  Surgeon: Leandrew Koyanagi, MD;  Location: [facility] ORS;  Service: Ophthalmology;  Laterality: Right;  [place]: 01:29.7 AP%; 21.9 CDE: 19.58  Lot # [id number]  . CATARACT EXTRACTION W/PHACO Left 01/01/2016   Procedure: CATARACT EXTRACTION PHACO AND INTRAOCULAR LENS PLACEMENT ([place]);  Surgeon: Leandrew Koyanagi, MD;  Location: [facility] ORS;  Service: Ophthalmology;  Laterality: Left;  [place] 1.03 AP%  13.7 CDE 8.65 FLUID PACK LOT # [id number]  . CHOLECYSTECTOMY    . EYE SURGERY    . NASAL SINUS SURGERY      OB History    No data available       Home Medications    Prior to Admission medications   Medication Sig Start Date End Date Taking? Authorizing Provider  acetaminophen (TYLENOL) 500 MG tablet Take 500 mg by mouth every 6 (six) hours as needed for mild pain.   Yes [provider]  allopurinol (ZYLOPRIM) 100 MG tablet Take 100 mg by mouth 2 (two) times daily.    Yes [provider]  apixaban (ELIQUIS) 5 MG TABS tablet Take 5 mg by mouth 2 (two) times daily.   Yes [provider]  cetirizine (ZYRTEC) 10 MG tablet Take 10 mg by mouth daily.   Yes [provider]  diltiazem (CARDIZEM CD) 180 MG 24 hr capsule Take 180 mg by mouth daily.   Yes [provider]  diltiazem (DILACOR XR) 180 MG 24 hr capsule Take 180 mg by mouth daily.   Yes [provider]  furosemide (LASIX) 40 MG tablet Take 40 mg by mouth daily.    Yes [provider]  gabapentin (NEURONTIN) 100 MG capsule Take 100 mg by mouth 3 (three) times daily.   Yes [provider]  insulin detemir (LEVEMIR) 100 UNIT/ML injection Inject 70 Units  into the skin at bedtime.   Yes [provider]  ipratropium (ATROVENT HFA) 17 MCG/ACT inhaler Inhale 2 puffs into the lungs every 6 (six) hours.   Yes [provider]  levothyroxine (SYNTHROID, LEVOTHROID) 112 MCG tablet Take 112 mcg by mouth daily before breakfast.   Yes [provider]  meloxicam (MOBIC) 7.5 MG tablet Take 7.5 mg by mouth daily. Reported on 01/01/2016   Yes [provider]  metoprolol succinate (TOPROL-XL) 50 MG 24 hr tablet Take 100 mg by mouth daily. Take with or immediately following a meal.   Yes [provider]  Multiple Vitamins-Minerals (MULTIVITAMIN ADULTS 50+ PO) Take 1 tablet by mouth daily.   Yes [provider]  potassium chloride SA  (K-DUR,KLOR-CON) 20 MEQ tablet Take 20 mEq by mouth daily.   Yes [provider]  pravastatin (PRAVACHOL) 80 MG tablet Take 80 mg by mouth daily.   Yes [provider]  ranitidine (ZANTAC) 150 MG tablet Take 150 mg by mouth at bedtime.   Yes [provider]  SAXagliptin-MetFORMIN (KOMBIGLYZE XR PO) Take 500 mg by mouth daily.   Yes [provider]    Family History Family History  Problem Relation Age of Onset  . CAD Brother        64's    Social History Social History  Substance Use Topics  . Smoking status: Never Smoker  . Smokeless tobacco: Never Used  . Alcohol use No     Allergies   Codeine and Sulfa antibiotics   Review of Systems Review of Systems  Unable to perform ROS: Patient nonverbal     Physical Exam Updated Vital Signs BP (!) 85/71   Pulse 82   Temp (!) 102.9 F (39.4 C) (Core (Comment)) Comment (Src): temp foley  Resp (!) 31   Ht '5\' 5"'$  (1.651 m)   Wt 118 kg (260 lb 2.3 oz)   LMP 01/23/1998 (Approximate)   SpO2 97%   BMI 43.29 kg/m   Physical Exam  Constitutional: She appears well-developed.  HENT:  Head: Atraumatic.  Eyes:  Pupils around 4 mm.  Neck: Neck supple.  Cardiovascular:  Initially pulseless but pulses returned after epinephrine.  Pulmonary/Chest:  King airway in place. Equal breath sounds bilaterally.  Abdominal: She exhibits distension.  Musculoskeletal: She exhibits edema.  Neurological:  Rare spontaneous breathing. No clear gag reflex. No response to pain.  Skin: Skin is warm.     ED Treatments / Results  Labs (all labs ordered are listed, but only abnormal results are displayed) Labs Reviewed  CBC WITH DIFFERENTIAL/PLATELET - Abnormal; Notable for the following:       Result Value   WBC 49.7 (*)    MCV 104.4 (*)    Neutro Abs 35.8 (*)    Lymphs Abs 11.4 (*)    Monocytes Absolute 2.5 (*)    All other components within normal limits  TROPONIN I - Abnormal; Notable for the  following:    Troponin I 0.80 (*)    All other components within normal limits  COMPREHENSIVE METABOLIC PANEL - Abnormal; Notable for the following:    Chloride 99 (*)    CO2 18 (*)    Glucose, Bld 393 (*)    Creatinine, Ser 1.32 (*)    Calcium 8.4 (*)    Total Protein 6.1 (*)    Albumin 3.1 (*)    AST 256 (*)    ALT 145 (*)    GFR calc non Af Amer 42 (*)  GFR calc Af Amer 49 (*)    Anion gap 20 (*)    All other components within normal limits  HEPARIN LEVEL (UNFRACTIONATED) - Abnormal; Notable for the following:    Heparin Unfractionated 1.36 (*)    All other components within normal limits  URINALYSIS, ROUTINE W REFLEX MICROSCOPIC - Abnormal; Notable for the following:    APPearance CLOUDY (*)    Glucose, UA 50 (*)    Hgb urine dipstick MODERATE (*)    Protein, ur 30 (*)    All other components within normal limits  GLUCOSE, CAPILLARY - Abnormal; Notable for the following:    Glucose-Capillary 334 (*)    All other components within normal limits  LACTIC ACID, PLASMA - Abnormal; Notable for the following:    Lactic Acid, Venous 5.3 (*)    All other components within normal limits  URINALYSIS, MICROSCOPIC (REFLEX) - Abnormal; Notable for the following:    Bacteria, UA RARE (*)    Squamous Epithelial / LPF 0-5 (*)    All other components within normal limits  GLUCOSE, CAPILLARY - Abnormal; Notable for the following:    Glucose-Capillary 301 (*)    All other components within normal limits  GLUCOSE, CAPILLARY - Abnormal; Notable for the following:    Glucose-Capillary 296 (*)    All other components within normal limits  I-STAT CHEM 8, ED - Abnormal; Notable for the following:    BUN 27 (*)    Glucose, Bld 354 (*)    Calcium, Ion 0.91 (*)    All other components within normal limits  I-STAT CG4 LACTIC ACID, ED - Abnormal; Notable for the following:    Lactic Acid, Venous 10.23 (*)    All other components within normal limits  POCT I-STAT 3, ART BLOOD GAS (G3+) -  Abnormal; Notable for the following:    pH, Arterial 7.239 (*)    Bicarbonate 18.3 (*)    Acid-base deficit 8.0 (*)    All other components within normal limits  CULTURE, BLOOD (ROUTINE X 2)  CULTURE, BLOOD (ROUTINE X 2)  URINE CULTURE  CULTURE, RESPIRATORY (NON-EXPECTORATED)  MRSA PCR SCREENING  ETHANOL  RAPID URINE DRUG SCREEN, HOSP PERFORMED  APTT  BLOOD GAS, ARTERIAL  BLOOD GAS, ARTERIAL  BLOOD GAS, ARTERIAL  HIV ANTIBODY (ROUTINE TESTING)  CBC  BASIC METABOLIC PANEL  MAGNESIUM  PHOSPHORUS  HEPATIC FUNCTION PANEL  LACTIC ACID, PLASMA    EKG  EKG Interpretation  Date/Time:  Tuesday February 11 2017 16:01:52 EDT Ventricular Rate:  115 PR Interval:    QRS Duration: 162 QT Interval:  384 QTC Calculation: 501 R Axis:   91 Text Interpretation:  Atrial fibrillation Ventricular premature complex Right bundle branch block Confirmed by Davonna Belling [id number]) on 02/04/2017 4:22:46 PM       Radiology Ct Head Wo Contrast  Result Date: 02/01/2017 CLINICAL DATA:  Found unresponsive in a car. Cardiopulmonary resuscitation. EXAM: CT HEAD WITHOUT CONTRAST TECHNIQUE: Contiguous axial images were obtained from the base of the skull through the vertex without intravenous contrast. COMPARISON:  None. FINDINGS: Brain: Mild generalized atrophy. No sign of old or acute infarction, mass lesion, hemorrhage, hydrocephalus or extra-axial collection. Vascular: No abnormal vascular finding. Skull: Normal Sinuses/Orbits: Extensive inflammatory sinus disease most pronounced affecting the maxillary sinuses. Other: None significant IMPRESSION: No acute intracranial finding.  Mild atrophy. Extensive paranasal sinusitis. Electronically Signed   By: Nelson Chimes M.D.   On: 02/01/2017 19:03   Dg Chest Portable 1 View  Result Date:  02/18/2017 CLINICAL DATA:  Central line placement EXAM: PORTABLE CHEST 1 VIEW COMPARISON:  Earlier today FINDINGS: Endotracheal tube tip between the clavicular heads and carina.  An orogastric tube reaches the stomach. New left IJ central line with tip at the upper cavoatrial junction. No visible pneumothorax. Stable borderline cardiomegaly, accentuated by technique. Perihilar opacities, greater on the right. No evidence of effusion. Anterior right fifth and sixth rib fractures. Anterior left fifth rib fracture is likely. IMPRESSION: 1. No acute finding related to left IJ placement. The tip is at the upper cavoatrial junction. 2. Perihilar and interstitial opacities which could be from edema or aspiration. 3. Anterior right fifth and sixth rib fractures. Probable anterior left fifth rib fracture. Electronically Signed   By: Monte Fantasia M.D.   On: 02/25/2017 18:44   Dg Chest Portable 1 View  Result Date: 02/23/2017 CLINICAL DATA:  Found unresponsive in vehicle.  Status post CPR. EXAM: PORTABLE CHEST 1 VIEW COMPARISON:  None. FINDINGS: ET tube tip is above the carina. There is a nasogastric tube with tip in the stomach. Heart size is normal. No pleural effusion or edema. Lungs are clear. IMPRESSION: 1. Appropriately positioned support apparatus as above. 2. No focal cardiopulmonary abnormalities noted. Electronically Signed   By: Kerby Moors M.D.   On: 02/24/2017 16:39    Procedures Procedures (including critical care time)  Medications Ordered in ED Medications  EPINEPHrine (ADRENALIN) 1 MG/10ML injection (1 mg Intravenous Given 02/25/2017 1609)  sodium bicarbonate injection (50 mEq Intravenous Given 02/23/2017 1612)  midazolam (VERSED) 2 MG/2ML injection (not administered)  fentaNYL (SUBLIMAZE) injection 100 mcg (not administered)  fentaNYL (SUBLIMAZE) injection 100 mcg (25 mcg Intravenous Given 01/27/2017 2255)  midazolam (VERSED) injection 2 mg (2 mg Intravenous Given 02/21/2017 1641)  midazolam (VERSED) injection 2 mg (2 mg Intravenous Given 02/14/2017 1936)  0.9 %  sodium chloride infusion (not administered)  amiodarone (NEXTERONE PREMIX) 360-4.14 MG/200ML-% (1.8 mg/mL) IV  infusion (not administered)  Ampicillin-Sulbactam (UNASYN) 3 g in sodium chloride 0.9 % 100 mL IVPB (3 g Intravenous New Bag/Given 02/09/2017 2355)  amiodarone (NEXTERONE PREMIX) 360-4.14 MG/200ML-% (1.8 mg/mL) IV infusion (60 mg/hr Intravenous Rate/Dose Verify 02/10/2017 2300)  amiodarone (NEXTERONE PREMIX) 360-4.14 MG/200ML-% (1.8 mg/mL) IV infusion (30 mg/hr Intravenous Rate/Dose Change 02/12/17 0000)  norepinephrine (LEVOPHED) 16 mg in dextrose 5 % 250 mL (0.064 mg/mL) infusion (35 mcg/min Intravenous Rate/Dose Change 02/12/17 0000)  pantoprazole (PROTONIX) injection 40 mg (40 mg Intravenous Given 01/28/2017 2122)  chlorhexidine gluconate (MEDLINE KIT) (PERIDEX) 0.12 % solution 15 mL (15 mLs Mouth Rinse Given 02/19/2017 2048)  MEDLINE mouth rinse (15 mLs Mouth Rinse Given 02/24/2017 2327)  insulin regular (NOVOLIN R,HUMULIN R) 100 Units in sodium chloride 0.9 % 100 mL (1 Units/mL) infusion ( Intravenous Rate/Dose Change 02/12/17 0000)  sodium bicarbonate 150 mEq in sterile water 1,000 mL infusion ( Intravenous Rate/Dose Verify 02/12/17 0000)  vasopressin (PITRESSIN) 40 Units in sodium chloride 0.9 % 250 mL (0.16 Units/mL) infusion (0.03 Units/min Intravenous Rate/Dose Verify 02/12/17 0000)  sodium bicarbonate 1 mEq/mL injection (50 mEq  Given 02/03/2017 2233)  sodium bicarbonate 1 mEq/mL injection (50 mEq  Given 02/07/2017 2234)     Initial Impression / Assessment and Plan / ED Course  I have reviewed the triage vital signs and the nursing notes.  Pertinent labs & imaging results that were available during my care of the patient were reviewed by me and considered in my medical decision making (see chart for details).     Patient presents in  cardiac arrest. Had return of vitals with epinephrine. Started on levo fed drip. Did however have loss of vitals again while levo fed drip was being started. Additional epinephrine given. Had return of vitals. King airway change over to ET tube. Discussed with family members.  Admit to ICU. Also seen by cardiology in the ER. Had around 5 minutes of downtime before started CPR. Seen in the ER by critical care. White count elevated. Initial temperature not done for the first couple hours. Lactic acid is elevated. No pneumonia on x-ray. Admit to ICU. No initial source of infection but easily could've had an aspiration  INTUBATION Performed by: Mackie Pai.  Required items: required blood products, implants, devices, and special equipment available Patient identity confirmed: provided demographic data and hospital-assigned identification number Time out: Immediately prior to procedure a "time out" was called to verify the correct patient, procedure, equipment, support staff and site/side marked as required.  Indications: cpr  Intubation method: Glidescope Laryngoscopy   Preoxygenation: BVM  Sedatives: none  Tube Size: 7.5 cuffed  Post-procedure assessment: chest rise and ETCO2 monitor Breath sounds: equal and absent over the epigastrium Tube secured with: ETT holder Chest x-ray interpreted by radiologist and me.  Chest x-ray findings: endotracheal tube in appropriate position  Patient tolerated the procedure well with no immediate complications.    Final Clinical Impressions(s) / ED Diagnoses   Final diagnoses:  Cardiac arrest [facility])    New Prescriptions Current Discharge Medication List       Davonna Belling, MD 02/12/17 [phone]

## 2017-02-11 NOTE — ED Notes (Signed)
Central Line being placed by Joneen RoachPaul Hoffman, PA at this time.

## 2017-02-11 NOTE — Progress Notes (Signed)
   02/05/2017 1720  Clinical Encounter Type  Visited With Health care provider  Visit Type Follow-up  Spiritual Encounters  Spiritual Needs Emotional  Stress Factors  Patient Stress Factors Health changes  Family Stress Factors Not reviewed  Referral from on call chaplain. Family was in consult B but not there at arrival. Requested page should family require services.

## 2017-02-11 NOTE — Progress Notes (Signed)
eLink Physician-Brief Progress Note Patient Name: Vito Backersatricia H Weninger DOB: 1950-08-04 MRN: [id number]   Date of Service  02/17/2017  HPI/Events of Note  Coffee ground emesis, also elevated fsbs  eICU Interventions  Stop heparin infusion, start insulin infusion     Intervention Category Evaluation Type: Other  Erin FullingKurian Yisell Sprunger 01/27/2017, 8:44 PM

## 2017-02-11 NOTE — ED Notes (Signed)
Pt remains unresponsive with palpable pulse at this time.

## 2017-02-11 NOTE — Progress Notes (Signed)
Pharmacy Antibiotic Note  Vito Backersatricia H Pickering is a 66 y.o. female admitted on 2016/09/09 with aspiration pneumonia.  Pharmacy has been consulted for unasyn dosing. Temperature not yet documented but WBC is elevated at 49/7. SCr is mildly elevated at 1.32. Lactic acid is elevated at 10.23 as expected.   Plan: Unasyn 3gm IV Q6H F/u renal fxn, C&S, clinical status  Height: 5\' 5"  (165.1 cm) Weight: 260 lb 2.3 oz (118 kg) IBW/kg (Calculated) : 57  No data recorded.   Recent Labs Lab 05/18/2017 1621 05/18/2017 1634 05/18/2017 1649  WBC  --  49.7*  --   CREATININE 0.90 1.32*  --   LATICACIDVEN  --   --  10.23*    Estimated Creatinine Clearance: 56.1 mL/min (A) (by C-G formula based on SCr of 1.32 mg/dL (H)).    Allergies  Allergen Reactions  . Codeine   . Sulfa Antibiotics     Antimicrobials this admission: Unasyn 7/17>>  Dose adjustments this admission: N/A  Microbiology results: Pending  Thank you for allowing pharmacy to be a part of this patient's care.  Quintessa Simmerman, Drake LeachRachel Lynn 2016/09/09 5:49 PM

## 2017-02-11 NOTE — Progress Notes (Addendum)
Dr. Belia HemanKasa notified about questionable blood in OG tube and orders to start heparin, CVP of 12 and lactic of 10 with no documented fluid boluses, HR a.fib in 140s-150s on 15 mcg of Levophed, monimal urine output since insertion of foley, and elevated blood glucoses. Orders received, will continue to monitor.  Dr. Belia HemanKasa notified of increased levophed requirement and ABG results. Orders for bicarb drip and vasopressin to be started. Will continue to monitor.  E-link nurse notified about pressure 40/20 maxed on levophed, starting vasopressin now, request for bicarb while waiting on bicarb gtt. Verbal orders from Dr. Belia HemanKasa received for 5 amps of bicarb. Notified about lactic acid results of 5.3 Will implement orders and continue to monitor patient. Husband called and updated.

## 2017-02-11 NOTE — Consult Note (Signed)
Cardiology Consult    Patient ID: Tammie Stone MRN: [id number], DOB/AGE: 01/07/1951   Admit date: 02/22/2017 Date of Consult: 02/05/2017  Primary Physician: Judeen Hammans, MD Reason for Consult: Cardiac Arrest Primary Cardiologist: [facility] - Dr. Laurice Record Requesting Provider: Dr. Rubin Payor   History of Present Illness    Tammie Stone is a 66 y.o. female with past medical history of chronic diastolic CHF, persistent atrial fibrillation (on Eliquis), HTN, HLD, and Type 2 DM who is being seen today for the evaluation of a cardiac arrest at the request of Dr. Rubin Payor.   She presents to [facility] on 02/05/2017 as a cardiac arrest. She had traveled to town to pick her son up from work when he went out to the car and found her to be unresponsive. Estimated time between arriving to the site and being found by her son is approximately 10-15 minutes. EMS was called and upon their arrival she was in VF/VT arrest but then went into PEA arrest after 1 shock. Received over 8 Epi's along with Amiodarone and CRP was still in progress upon arrival to the ED. She required upwards of 45 minutes of ACLS prior to return of ROSC.   In reviewing history from Care Everywhere, she is followed by Dr. Laurice Record from [facility], having last been evaluated in 12/2016. She was continued on Cardizem CD and Toprol-XL for rate-control along with Eliquis for anticoagulation. An echocardiogram in 06/2016 showed a preserved EF of 55-60% with aortic sclerosis. NST in 10/2015 showed no evidence of ischemia or prior infarction.   In talking with the patient's family, she was tested for OSA in the past but never diagnosed with sleep apnea. Sleeps in a recliner at baseline. Family history is significant for her bother having an MI in his 48's. No prior alcohol use or tobacco use.   Labs show Na+ of 136, K+ 4.1, creatinine 0.90, glucose 354, WBC 49.7, Hgb 14.1, Lactic Acid 10.23. CXR shows no acute cardiopulmonary  abnormalities. EKG shows atrial fibrillation, HR 119, with RBBB.     Past Medical History   Past Medical History:  Diagnosis Date  . Arthritis   . CHF (congestive heart failure) (HCC)   . Diabetes mellitus without complication (HCC)   . Dysrhythmia    afib  . Edema    feet  . GERD (gastroesophageal reflux disease)   . Gout   . Hypothyroidism   . Palpitations    rare  . PONV (postoperative nausea and vomiting)     Past Surgical History:  Procedure Laterality Date  . ABDOMINAL HYSTERECTOMY    . APPENDECTOMY    . CATARACT EXTRACTION W/PHACO Right 03/30/2015   Procedure: CATARACT EXTRACTION PHACO AND INTRAOCULAR LENS PLACEMENT (IOC);  Surgeon: Lockie Mola, MD;  Location: [facility] ORS;  Service: Ophthalmology;  Laterality: Right;  [place]: 01:29.7 AP%; 21.9 CDE: 19.58  Lot # [id number]  . CATARACT EXTRACTION W/PHACO Left 01/01/2016   Procedure: CATARACT EXTRACTION PHACO AND INTRAOCULAR LENS PLACEMENT (IOC);  Surgeon: Lockie Mola, MD;  Location: [facility] ORS;  Service: Ophthalmology;  Laterality: Left;  [place] 1.03 AP% 13.7 CDE 8.65 FLUID PACK LOT # [id number]  . CHOLECYSTECTOMY    . EYE SURGERY    . NASAL SINUS SURGERY       Allergies  Allergies  Allergen Reactions  . Codeine   . Sulfa Antibiotics     Inpatient Medications    . midazolam      . pantoprazole sodium  40 mg  Per Tube Daily    Family History    Family History  Problem Relation Age of Onset  . CAD Brother        26's    Social History    Social History   Social History  . Marital status: Married    Spouse name: N/A  . Number of children: N/A  . Years of education: N/A   Occupational History  . Not on file.   Social History Main Topics  . Smoking status: Never Smoker  . Smokeless tobacco: Never Used  . Alcohol use No  . Drug use: No  . Sexual activity: Not on file   Other Topics Concern  . Not on file   Social History Narrative  . No narrative on file     Review of Systems      Unable to be obtained as the patient is currently intubated.   Physical Exam    Blood pressure 113/81, pulse (!) 143, resp. rate (!) 29, height 5' 5.5" (1.664 m), last menstrual period 01/23/1998, SpO2 91 %.  General: Morbidly obese Caucasian female, currently intubated.  Psych: Unable to be assessed.  Neuro: Intubated, opens eyes at random times. Not responding to painful stimuli.  HEENT: Normal  Neck: Supple without bruits or JVD. Lungs:  Resp regular and unlabored, CTA anteriorly. Heart: RRR no s3, s4, or murmurs. Abdomen: Soft, non-tender, non-distended, BS + x 4.  Extremities: No clubbing, cyanosis or edema. DP/PT/Radials 2+ and equal bilaterally.  Labs    Troponin (Point of Care Test) No results for input(s): TROPIPOC in the last 72 hours. No results for input(s): CKTOTAL, CKMB, TROPONINI in the last 72 hours. Lab Results  Component Value Date   WBC 49.7 (H) 03/11/2017   HGB 14.1 Mar 11, 2017   HCT 44.8 11-Mar-2017   MCV 104.4 (H) 03-11-2017   PLT 224 2017-03-11     Recent Labs Lab 03/11/2017 1634  NA 137  K 4.1  CL 99*  CO2 18*  BUN 20  CREATININE 1.32*  CALCIUM 8.4*  PROT 6.1*  BILITOT 0.7  ALKPHOS 121  ALT 145*  AST 256*  GLUCOSE 393*   No results found for: CHOL, HDL, LDLCALC, TRIG No results found for: [facility]   Radiology Studies    Dg Chest Portable 1 View  Result Date: Mar 11, 2017 CLINICAL DATA:  Found unresponsive in vehicle.  Status post CPR. EXAM: PORTABLE CHEST 1 VIEW COMPARISON:  None. FINDINGS: ET tube tip is above the carina. There is a nasogastric tube with tip in the stomach. Heart size is normal. No pleural effusion or edema. Lungs are clear. IMPRESSION: 1. Appropriately positioned support apparatus as above. 2. No focal cardiopulmonary abnormalities noted. Electronically Signed   By: Signa Kell M.D.   On: 03/11/17 16:39    EKG & Cardiac Imaging    EKG: Atrial fibrillation, HR 119, with RBBB.   - Personally Reviewed  Echocardiogram:  06/2016  Echocardiogram W Colorflow Spectral Doppler12/05/2016 [facility] Component Name Value Ref Range  LV Diastolic Diameter PLAX 4.0 cm  LV Systolic Diameter PLAX 2.19 cm  IVS Diastolic Thickness 1.1 cm  LVPW Diastolic Thickness 1.1 cm  LA Systolic Diameter LX 4.9 cm  LA Area 4C View 15.60 cm2  LA Area 2C View 9.63 cm2  LA Volume 30.65 cm3  Aorta at Sinuses Diameter 3.3 cm  AV Peak Velocity 1.10 m/s  AV Peak Gradient 4.84   Mitral E Point Velocity 0.01 m/s  TR Peak Velocity 2.4  m/s  PV Peak Velocity 0.86 m/s  Pulmonary Artery Systolic Pressure 23 mmHg   PV peak gradient 2.95 mmHg   Result Narrative   Technically VERY difficult study due to chest wall/lung interference  Normal left ventricular systolic function, ejection fraction 55 to 60%  Aortic sclerosis  Normal right ventricular systolic function    NST: 10/2015 Impressions: 1. No definite evidence of ischemia or infarction 2. Normal LV systolic function.  This result does not preclude the possibility of underlying mild to moderate, non-obstructive coronary artery disease. Recommend clinical  correlation.  Assessment & Plan    1. Cardiac Arrest - she was found to be unresponsive while sitting in her car. Estimated down time of 10-15 minutes before EMS arrival and initially in VF/VT arrest then PEA arrest. Required upwards of 45 minutes of ACLS prior to return of ROSC.  - echocardiogram in 06/2016 showed a preserved EF of 55-60% with aortic sclerosis. NST in 10/2015 showed no evidence of ischemia or prior infarction.  - EKG today shows no acute ST or T-wave changes.  - further cardiac evaluation will depend on her stability and neurologic recovery as no emergent cardiac procedures are indicated at this time.  2. Persistent Atrial Fibrillation - This patients CHA2DS2-VASc Score and unadjusted Ischemic Stroke Rate (% per year) is equal to 3.2 % stroke rate/year from a score of 3 (HTN, Female, DM). On  Eliquis PTA. Consider starting Heparin drip pending results of Head CT. - PTA Cardizem and Toprol-XL currently held in the setting of her labile HR and BP.   3. Chronic Diastolic CHF - echo in 06/2016 showed a preserved EF of 55-60%.  - CXR with no evidence of acute volume overload. Follow volume statue closely.    Signed, Ellsworth LennoxBrittany M Strader, PA-C 02/02/2017, 5:34 PM Pager: [phone]   History and all data above reviewed.  Patient examined. Unfortunate situation.  The patient was found by her son to be unresponsive in her car.   The details are as outlined above.  She has had a prolonged resuscitation and is intubated and unresponsive in the ED.   Initial rhythm was apparently Vfib.  Her son reports that she apparently had food aspirated when she was intubated.  She had not been complaining of anything at home except for one vague episode of chest pain that she mentioned to her husband perhaps a week ago.  She otherwise was able to vacuum a church with weekend without problems.  EKG post resuscitation is atrial fib with new RBBB. Lactic acid is elevated.  Trop is mildly elevated.    I agree with the findings as above.  The patient exam reveals UJW:[id number]:Irregular  ,  Lungs: Decreased breath sounds  ,  Abd: Positive bowel sounds, no rebound no guarding, Ext No edema  .  All available labs, radiology testing, previous records reviewed. Agree with documented assessment and plan. Vfib arrest:  Prolonged resuscitation.  Prognosis for meaningful neurologic recovery is poor.  Given the prolonged resuscitation and lack of acute ST elevation there is no indication for urgent cath.  I would suggest heparin if head CT is OK.    Fayrene FearingJames Keyshun Elpers  5:51 PM  02/15/2017

## 2017-02-11 NOTE — ED Notes (Signed)
Palpable pulses at this time.  No spontaneous resp at this time

## 2017-02-11 NOTE — ED Notes (Signed)
Xray called for post central line placement

## 2017-02-11 NOTE — ED Notes (Signed)
Dr. Antoine PocheHochrein in to assess pt

## 2017-02-11 NOTE — Progress Notes (Signed)
ANTICOAGULATION CONSULT NOTE - Initial Consult  Pharmacy Consult for heparin Indication: atrial fibrillation  Allergies  Allergen Reactions  . Codeine   . Sulfa Antibiotics     Patient Measurements: Height: 5\' 5"  (165.1 cm) Weight: 260 lb 2.3 oz (118 kg) IBW/kg (Calculated) : 57 Heparin Dosing Weight: 85kg  Vital Signs: BP: 113/81 (07/17 1656) Pulse Rate: 143 (07/17 1656)  Labs:  Recent Labs  02/01/2017 1621 02/21/2017 1634  HGB 14.6 14.1  HCT 43.0 44.8  PLT  --  224  CREATININE 0.90 1.32*    Estimated Creatinine Clearance: 56.1 mL/min (A) (by C-G formula based on SCr of 1.32 mg/dL (H)).   Medical History: Past Medical History:  Diagnosis Date  . Arthritis   . CHF (congestive heart failure) (HCC)   . Diabetes mellitus without complication (HCC)   . Dysrhythmia    afib  . Edema    feet  . GERD (gastroesophageal reflux disease)   . Gout   . Hypothyroidism   . Palpitations    rare  . PONV (postoperative nausea and vomiting)     Medications:  Infusions:  . sodium chloride    . amiodarone    . amiodarone 60 mg/hr (02/05/2017 1811)  . [START ON 02/12/2017] amiodarone    . ampicillin-sulbactam (UNASYN) IV 3 g (02/02/2017 1952)  . heparin    . norepinephrine (LEVOPHED) Adult infusion 15 mcg/min (02/19/2017 1940)    Assessment: [age] yof presented to the ED as a cardiac arrest. ROSC achieved. Pt is on chronic apixaban for history of afib. Unknown timing of last dose but will assume she took a dose this morning. Baseline heparin level is elevated as expected and aPTT is WNL. Baseline CBC is WNL. CT head negative for bleed. Noted coffee ground emesis during CPR but per discussion with MD, not currently concerned for bleed.   Goal of Therapy:  Heparin level 0.3-0.7 units/ml aPTT 66-102 seconds Monitor platelets by anticoagulation protocol: Yes   Plan:  Heparin gtt 1200 units/hr  Check a 6 hr aPTT Daily heparin level, CBC and aPTT Monitor closely for  bleeding  Lionardo Haze, Drake Leachachel Lynn 02/24/2017,5:51 PM

## 2017-02-12 ENCOUNTER — Inpatient Hospital Stay (HOSPITAL_COMMUNITY): Payer: Medicaid Other

## 2017-02-12 DIAGNOSIS — G931 Anoxic brain damage, not elsewhere classified: Secondary | ICD-10-CM

## 2017-02-12 DIAGNOSIS — J9601 Acute respiratory failure with hypoxia: Secondary | ICD-10-CM

## 2017-02-12 DIAGNOSIS — R579 Shock, unspecified: Secondary | ICD-10-CM

## 2017-02-12 LAB — POCT I-STAT 3, ART BLOOD GAS (G3+)
ACID-BASE EXCESS: 2 mmol/L (ref 0.0–2.0)
Bicarbonate: 24.7 mmol/L (ref 20.0–28.0)
O2 SAT: 99 %
TCO2: 26 mmol/L (ref 0–100)
pCO2 arterial: 36.1 mmHg (ref 32.0–48.0)
pH, Arterial: 7.454 — ABNORMAL HIGH (ref 7.350–7.450)
pO2, Arterial: 132 mmHg — ABNORMAL HIGH (ref 83.0–108.0)

## 2017-02-12 LAB — BLOOD GAS, ARTERIAL
Acid-base deficit: 5.8 mmol/L — ABNORMAL HIGH (ref 0.0–2.0)
Bicarbonate: 18.1 mmol/L — ABNORMAL LOW (ref 20.0–28.0)
DRAWN BY: 280981
FIO2: 100
MECHVT: 470 mL
O2 SAT: 98.5 %
PATIENT TEMPERATURE: 98.6
PCO2 ART: 29.9 mmHg — AB (ref 32.0–48.0)
PEEP: 12 cmH2O
PH ART: 7.4 (ref 7.350–7.450)
RATE: 20 resp/min
pO2, Arterial: 140 mmHg — ABNORMAL HIGH (ref 83.0–108.0)

## 2017-02-12 LAB — CBC
HCT: 45.4 % (ref 36.0–46.0)
Hemoglobin: 15.4 g/dL — ABNORMAL HIGH (ref 12.0–15.0)
MCH: 33.8 pg (ref 26.0–34.0)
MCHC: 33.9 g/dL (ref 30.0–36.0)
MCV: 99.6 fL (ref 78.0–100.0)
PLATELETS: 255 10*3/uL (ref 150–400)
RBC: 4.56 MIL/uL (ref 3.87–5.11)
RDW: 14.3 % (ref 11.5–15.5)
WBC: 38.8 10*3/uL — AB (ref 4.0–10.5)

## 2017-02-12 LAB — BASIC METABOLIC PANEL
Anion gap: 16 — ABNORMAL HIGH (ref 5–15)
BUN: 35 mg/dL — AB (ref 6–20)
CALCIUM: 7.6 mg/dL — AB (ref 8.9–10.3)
CO2: 24 mmol/L (ref 22–32)
CREATININE: 2.68 mg/dL — AB (ref 0.44–1.00)
Chloride: 102 mmol/L (ref 101–111)
GFR calc Af Amer: 21 mL/min — ABNORMAL LOW (ref >60)
GFR, EST NON AFRICAN AMERICAN: 18 mL/min — AB (ref >60)
GLUCOSE: 134 mg/dL — AB (ref 65–99)
Potassium: 3.2 mmol/L — ABNORMAL LOW (ref 3.5–5.1)
SODIUM: 142 mmol/L (ref 135–145)

## 2017-02-12 LAB — GLUCOSE, CAPILLARY
GLUCOSE-CAPILLARY: 143 mg/dL — AB (ref 65–99)
GLUCOSE-CAPILLARY: 145 mg/dL — AB (ref 65–99)
GLUCOSE-CAPILLARY: 148 mg/dL — AB (ref 65–99)
GLUCOSE-CAPILLARY: 169 mg/dL — AB (ref 65–99)
GLUCOSE-CAPILLARY: 184 mg/dL — AB (ref 65–99)
Glucose-Capillary: 141 mg/dL — ABNORMAL HIGH (ref 65–99)
Glucose-Capillary: 150 mg/dL — ABNORMAL HIGH (ref 65–99)
Glucose-Capillary: 138 mg/dL — ABNORMAL HIGH (ref 65–99)
Glucose-Capillary: 150 mg/dL — ABNORMAL HIGH (ref 65–99)
Glucose-Capillary: 136 mg/dL — ABNORMAL HIGH (ref 65–99)
Glucose-Capillary: 172 mg/dL — ABNORMAL HIGH (ref 65–99)
Glucose-Capillary: 201 mg/dL — ABNORMAL HIGH (ref 65–99)

## 2017-02-12 LAB — TRIGLYCERIDES: TRIGLYCERIDES: 109 mg/dL (ref <150)

## 2017-02-12 LAB — HEPATIC FUNCTION PANEL
ALBUMIN: 2.8 g/dL — AB (ref 3.5–5.0)
ALK PHOS: 85 U/L (ref 38–126)
ALT: 230 U/L — ABNORMAL HIGH (ref 14–54)
AST: 562 U/L — ABNORMAL HIGH (ref 15–41)
BILIRUBIN DIRECT: 0.2 mg/dL (ref 0.1–0.5)
BILIRUBIN TOTAL: 1.1 mg/dL (ref 0.3–1.2)
Indirect Bilirubin: 0.9 mg/dL (ref 0.3–0.9)
Total Protein: 5.7 g/dL — ABNORMAL LOW (ref 6.5–8.1)

## 2017-02-12 LAB — MAGNESIUM: Magnesium: 1.5 mg/dL — ABNORMAL LOW (ref 1.7–2.4)

## 2017-02-12 LAB — MRSA PCR SCREENING: MRSA BY PCR: POSITIVE — AB

## 2017-02-12 LAB — PHOSPHORUS: Phosphorus: 3.5 mg/dL (ref 2.5–4.6)

## 2017-02-12 LAB — LACTIC ACID, PLASMA: Lactic Acid, Venous: 4.6 mmol/L (ref 0.5–1.9)

## 2017-02-12 LAB — APTT: APTT: 184 s — AB (ref 24–36)

## 2017-02-12 MED ORDER — INSULIN GLARGINE 100 UNIT/ML ~~LOC~~ SOLN
20.0000 [IU] | SUBCUTANEOUS | Status: DC
  Administered 2017-02-12 – 2017-02-13 (×2): 20 [IU] via SUBCUTANEOUS
  Filled 2017-02-12 (×2): qty 0.2

## 2017-02-12 MED ORDER — INSULIN ASPART 100 UNIT/ML ~~LOC~~ SOLN
1.0000 [IU] | SUBCUTANEOUS | Status: DC
  Administered 2017-02-12 (×2): 2 [IU] via SUBCUTANEOUS
  Administered 2017-02-12: 3 [IU] via SUBCUTANEOUS
  Administered 2017-02-13 (×3): 2 [IU] via SUBCUTANEOUS

## 2017-02-12 MED ORDER — ACETAMINOPHEN 10 MG/ML IV SOLN
1000.0000 mg | Freq: Once | INTRAVENOUS | Status: AC
  Administered 2017-02-12: 1000 mg via INTRAVENOUS
  Filled 2017-02-12: qty 100

## 2017-02-12 MED ORDER — SODIUM CHLORIDE 0.9 % IV SOLN
0.0000 ug/min | INTRAVENOUS | Status: DC
  Administered 2017-02-12: 20 ug/min via INTRAVENOUS
  Administered 2017-02-12: 60 ug/min via INTRAVENOUS
  Filled 2017-02-12 (×2): qty 1

## 2017-02-12 MED ORDER — CHLORHEXIDINE GLUCONATE CLOTH 2 % EX PADS
6.0000 | MEDICATED_PAD | Freq: Every day | CUTANEOUS | Status: DC
  Administered 2017-02-13: 6 via TOPICAL

## 2017-02-12 MED ORDER — PROPOFOL 1000 MG/100ML IV EMUL
INTRAVENOUS | Status: AC
  Filled 2017-02-12: qty 100

## 2017-02-12 MED ORDER — HEPARIN (PORCINE) IN NACL 100-0.45 UNIT/ML-% IJ SOLN
1200.0000 [IU]/h | INTRAMUSCULAR | Status: DC
  Administered 2017-02-12: 1200 [IU]/h via INTRAVENOUS
  Filled 2017-02-12: qty 250

## 2017-02-12 MED ORDER — HEPARIN (PORCINE) IN NACL 100-0.45 UNIT/ML-% IJ SOLN: 800.0000 [IU]/h | INTRAMUSCULAR | Status: DC

## 2017-02-12 MED ORDER — SODIUM CHLORIDE 0.9 % IV SOLN
0.0000 ug/min | INTRAVENOUS | Status: DC
  Administered 2017-02-12 – 2017-02-13 (×2): 70 ug/min via INTRAVENOUS
  Filled 2017-02-12 (×3): qty 4

## 2017-02-12 MED ORDER — PROPOFOL 1000 MG/100ML IV EMUL
5.0000 ug/kg/min | INTRAVENOUS | Status: DC
  Administered 2017-02-12: 35 ug/kg/min via INTRAVENOUS
  Administered 2017-02-12: 5 ug/kg/min via INTRAVENOUS
  Administered 2017-02-13 (×2): 35 ug/kg/min via INTRAVENOUS
  Filled 2017-02-12 (×4): qty 100

## 2017-02-12 MED ORDER — MAGNESIUM SULFATE 2 GM/50ML IV SOLN
2.0000 g | Freq: Once | INTRAVENOUS | Status: AC
  Administered 2017-02-12: 2 g via INTRAVENOUS
  Filled 2017-02-12: qty 50

## 2017-02-12 MED ORDER — MUPIROCIN 2 % EX OINT
1.0000 "application " | TOPICAL_OINTMENT | Freq: Two times a day (BID) | CUTANEOUS | Status: DC
  Administered 2017-02-12 – 2017-02-13 (×3): 1 via NASAL
  Filled 2017-02-12: qty 22

## 2017-02-12 MED ORDER — SODIUM CHLORIDE 0.9% FLUSH: 10.0000 mL | INTRAVENOUS | Status: DC | PRN

## 2017-02-12 MED ORDER — SODIUM CHLORIDE 0.9 % IV SOLN
3.0000 g | Freq: Two times a day (BID) | INTRAVENOUS | Status: DC
  Administered 2017-02-12 – 2017-02-13 (×2): 3 g via INTRAVENOUS
  Filled 2017-02-12 (×3): qty 3

## 2017-02-12 MED ORDER — POTASSIUM CHLORIDE 10 MEQ/100ML IV SOLN
10.0000 meq | INTRAVENOUS | Status: AC
  Administered 2017-02-12 (×2): 10 meq via INTRAVENOUS
  Filled 2017-02-12 (×2): qty 100

## 2017-02-12 MED ORDER — CHLORHEXIDINE GLUCONATE CLOTH 2 % EX PADS: 6.0000 | MEDICATED_PAD | Freq: Every day | CUTANEOUS | Status: DC

## 2017-02-12 MED ORDER — ORAL CARE MOUTH RINSE
15.0000 mL | OROMUCOSAL | Status: DC
  Administered 2017-02-12 – 2017-02-13 (×11): 15 mL via OROMUCOSAL

## 2017-02-12 MED ORDER — POTASSIUM CHLORIDE 20 MEQ/15ML (10%) PO SOLN
40.0000 meq | Freq: Once | ORAL | Status: AC
  Administered 2017-02-12: 40 meq
  Filled 2017-02-12: qty 30

## 2017-02-12 MED ORDER — SODIUM CHLORIDE 0.9% FLUSH
10.0000 mL | Freq: Two times a day (BID) | INTRAVENOUS | Status: DC
  Administered 2017-02-12: 30 mL
  Administered 2017-02-12: 20 mL

## 2017-02-12 NOTE — Progress Notes (Signed)
ANTICOAGULATION CONSULT NOTE  Pharmacy Consult for heparin Indication: atrial fibrillation, ACS  Allergies  Allergen Reactions  . Codeine Other (See Comments)    Reaction unk  . Sulfa Antibiotics Other (See Comments)    Reaction unk    Patient Measurements: Height: 5\' 5"  (165.1 cm) Weight: 258 lb 13.1 oz (117.4 kg) IBW/kg (Calculated) : 57 Heparin Dosing Weight: 85kg  Vital Signs: Temp: 99.5 F (37.5 C) (07/18 1130) Temp Source: Core (Comment) (07/18 1130) BP: 112/64 (07/18 1130) Pulse Rate: 83 (07/18 1130)  Labs:  Recent Labs  02/15/2017 1621 02/12/2017 1634 02/20/2017 1817 02/12/17 0306  HGB 14.6 14.1  --  15.4*  HCT 43.0 44.8  --  45.4  PLT  --  224  --  255  APTT  --   --  36  --   HEPARINUNFRC  --   --  1.36*  --   CREATININE 0.90 1.32*  --  2.68*  TROPONINI  --  0.80*  --   --     Estimated Creatinine Clearance: 27.5 mL/min (A) (by C-G formula based on SCr of 2.68 mg/dL (H)).   Medical History: Past Medical History:  Diagnosis Date  . Arthritis   . CHF (congestive heart failure) (HCC)   . Diabetes mellitus without complication (HCC)   . Dysrhythmia    afib  . Edema    feet  . GERD (gastroesophageal reflux disease)   . Gout   . Hypothyroidism   . Palpitations    rare  . PONV (postoperative nausea and vomiting)     Medications:  Infusions:  . sodium chloride    . amiodarone 30 mg/hr (02/12/17 0805)  . ampicillin-sulbactam (UNASYN) IV    . insulin (NOVOLIN-R) infusion Stopped (02/12/17 1017)  . magnesium sulfate 1 - 4 g bolus IVPB 2 g (02/12/17 1130)  . norepinephrine (LEVOPHED) Adult infusion 35 mcg/min (02/12/17 1134)  . phenylephrine (NEO-SYNEPHRINE) Adult infusion 30 mcg/min (02/12/17 1058)  .  sodium bicarbonate (isotonic) infusion in sterile water 100 mL/hr at 02/12/17 0833  . vasopressin (PITRESSIN) infusion - *FOR SHOCK* 0.03 Units/min (02/12/17 0800)    Assessment: 65 yof presented as cardiac arrest. ROSC achieved. Pt on chronic  apixaban for history of afib. Unknown timing of last dose, but baseline heparin level elevated as expected, aPTT wnl. CBC stable. CT head negative for bleed. Noted coffee ground emesis during CPR but per discussion with MD, not currently concerned for bleed and heparin was started 7/17, then d/c'd overnight. Pharmacy consulted to resume heparin per CCM 7/18. No overt s/sx bleeding reported.   Will follow aPTTs while apixaban influencing heparin levels.  Goal of Therapy:  Heparin level 0.3-0.7 units/ml aPTT 66-102 seconds Monitor platelets by anticoagulation protocol: Yes   Plan:  No bolus Resume heparin gtt 1200 units/hr  6hr aPTT Daily heparin level, CBC, and aPTT Monitor closely for bleeding   Babs BertinHaley Leahann Lempke, PharmD, BCPS Clinical Pharmacist Rx Phone # for today: [phone] After 3:30PM, please call Main Rx: #[id number] 02/12/2017 11:38 AM

## 2017-02-12 NOTE — Progress Notes (Signed)
0410: MD Tyson AliasFeinstein notified of patients temp raising 39-40 degrees celsius in the last hour. Patients HR 160-180. Lactic Acid called. K called. Will continue to monitor patient.  Horton ChinMacKayla A Sherrie Marsan, RN  0430: cooling blanket applied. Patient desats to 6570s when laid flat becoming cyanotic in the face, but rebounded quickly when sat back up in bed. Now sats 94%. Temp now up to 40.3 degrees celsius. K being supplemented. Amio bolus received. HR remains 150-160s.

## 2017-02-12 NOTE — Progress Notes (Signed)
Pharmacy Antibiotic Note  Tammie Stone is a 66 y.o. female admitted on 02/10/2017 with aspiration pneumonia.  Pharmacy has been consulted for unasyn dosing - day #2. Tmax/24h 104.4. but WBC is elevated, trending down to 38.8. SCr bumped 1.32>2.68, CrCl~28. Lactic acid trending down to 4.6.  Plan:  Adjust Unasyn to 3gm IV Q12H for worsening renal function F/u renal fxn, C&S, clinical status, LOT  Height: 5\' 5"  (165.1 cm) Weight: 258 lb 13.1 oz (117.4 kg) IBW/kg (Calculated) : 57  Temp (24hrs), Avg:100.9 F (38.3 C), Min:98.1 F (36.7 C), Max:104.4 F (40.2 C)   Recent Labs Lab 02/05/2017 1621 02/21/2017 1634 02/10/2017 1649 01/31/2017 2135 02/12/17 0306 02/12/17 0315  WBC  --  49.7*  --   --  38.8*  --   CREATININE 0.90 1.32*  --   --  2.68*  --   LATICACIDVEN  --   --  10.23* 5.3*  --  4.6*    Estimated Creatinine Clearance: 27.5 mL/min (A) (by C-G formula based on SCr of 2.68 mg/dL (H)).    Allergies  Allergen Reactions  . Codeine Other (See Comments)    Reaction unk  . Sulfa Antibiotics Other (See Comments)    Reaction unk    Antimicrobials this admission: Unasyn 7/17>>  Dose adjustments this admission: 7/18: adjust unasyn to 3g IV q12h  Microbiology results: 7/17 BCx: 7/17 UC: 7/17 mrsa pcr: +  Tammie Stone, PharmD, BCPS Clinical Pharmacist Rx Phone # for today: [phone] After 3:30PM, please call Main Rx: #[id number] 02/12/2017 8:08 AM

## 2017-02-12 NOTE — Progress Notes (Signed)
ANTICOAGULATION CONSULT NOTE  Pharmacy Consult for heparin Indication: atrial fibrillation, ACS  Allergies  Allergen Reactions  . Codeine Other (See Comments)    Reaction unk  . Sulfa Antibiotics Other (See Comments)    Reaction unk    Patient Measurements: Height: 5\' 5"  (165.1 cm) Weight: 258 lb 13.1 oz (117.4 kg) IBW/kg (Calculated) : 57 Heparin Dosing Weight: 85kg  Vital Signs: Temp: 98.8 F (37.1 C) (07/18 1900) Temp Source: Core (Comment) (07/18 1900) BP: 108/82 (07/18 1900) Pulse Rate: 140 (07/18 2003)  Labs:  Recent Labs  02/12/2017 1621 02/16/2017 1634 02/07/2017 1817 02/12/17 0306 02/12/17 1814  HGB 14.6 14.1  --  15.4*  --   HCT 43.0 44.8  --  45.4  --   PLT  --  224  --  255  --   APTT  --   --  36  --  184*  HEPARINUNFRC  --   --  1.36*  --   --   CREATININE 0.90 1.32*  --  2.68*  --   TROPONINI  --  0.80*  --   --   --     Estimated Creatinine Clearance: 27.5 mL/min (A) (by C-G formula based on SCr of 2.68 mg/dL (H)).   Medical History: Past Medical History:  Diagnosis Date  . Arthritis   . CHF (congestive heart failure) (HCC)   . Diabetes mellitus without complication (HCC)   . Dysrhythmia    afib  . Edema    feet  . GERD (gastroesophageal reflux disease)   . Gout   . Hypothyroidism   . Palpitations    rare  . PONV (postoperative nausea and vomiting)     Medications:  Infusions:  . sodium chloride 250 mL (02/12/17 0800)  . amiodarone 30 mg/hr (02/12/17 2004)  . ampicillin-sulbactam (UNASYN) IV Stopped (02/12/17 1810)  . heparin    . norepinephrine (LEVOPHED) Adult infusion 33 mcg/min (02/12/17 2004)  . phenylephrine (NEO-SYNEPHRINE) Adult infusion 70 mcg/min (02/12/17 1802)  . propofol (DIPRIVAN) infusion 35 mcg/kg/min (02/12/17 1947)  . propofol    .  sodium bicarbonate (isotonic) infusion in sterile water 100 mL/hr at 02/12/17 1810  . vasopressin (PITRESSIN) infusion - *FOR SHOCK* 0.03 Units/min (02/12/17 0800)     Assessment: 65 yof presented as cardiac arrest. ROSC achieved. Pt on chronic apixaban for history of afib. Unknown timing of last dose, but baseline heparin level elevated as expected, aPTT wnl. CBC stable. CT head negative for bleed. Noted coffee ground emesis during CPR but per discussion with MD, not currently concerned for bleed and heparin was started 7/17, then d/c'd overnight. Pharmacy consulted to resume heparin per CCM 7/18. No overt s/sx bleeding reported.  Will follow aPTTs while apixaban influencing heparin levels. Heparin drip 1200 uts/hr running through peripheral IV and aptt elevated 183sec drawn through central line.     Goal of Therapy:  Heparin level 0.3-0.7 units/ml aPTT 66-102 seconds Monitor platelets by anticoagulation protocol: Yes   Plan:  Hold heparin 1hr Resume heparin gtt 1000 units/hr  Daily heparin level, CBC, and aPTT Monitor closely for bleeding   Leota SauersLisa Adaliz Dobis Pharm.D. CPP, BCPS Clinical Pharmacist [phone] 02/12/2017 8:15 PM

## 2017-02-12 NOTE — Progress Notes (Signed)
PULMONARY / CRITICAL CARE MEDICINE   Name: Tammie Stone MRN: [id number] DOB: 1950-10-24    ADMISSION DATE:  03-06-2017 CONSULTATION DATE:  03/06/2017  REFERRING MD:  Dr. Rubin Payor   CHIEF COMPLAINT:  Cardiac Arrest   HISTORY OF PRESENT ILLNESS:   66 y/o F admitted 7/17 after cardiac arrest.    The patient presented to [facility] on 7/17 after VF arrest. She was found pulseless in her car. EMS initial rhythm was VT/VF, shocked to PEA. CPR in progress on arrival to [facility]. Duration before [facility] approximately 1 hour. King airway was changed to ETT.   SUBJECTIVE:  Unresponsive, increasing pressor demand overnight.  VITAL SIGNS: BP (!) 87/54   Pulse 88   Temp (!) 101.3 F (38.5 C) (Core (Comment)) Comment: foley temp  Resp (!) 30   Ht 5\' 5"  (1.651 m)   Wt 117.4 kg (258 lb 13.1 oz)   LMP 01/23/1998 (Approximate)   SpO2 91%   BMI 43.07 kg/m   HEMODYNAMICS: CVP:  [4 mmHg-22 mmHg] 12 mmHg  VENTILATOR SETTINGS: Vent Mode: PRVC FiO2 (%):  [100 %] 100 % Set Rate:  [15 bmp-24 bmp] 24 bmp Vt Set:  [470 mL-570 mL] 470 mL PEEP:  [5 cmH20-10 cmH20] 8 cmH20 Plateau Pressure:  [16 cmH20-26 cmH20] 22 cmH20  INTAKE / OUTPUT: I/O last 3 completed shifts: In: 3776.5 [I.V.:3176.5; IV Piggyback:600] Out: 515 [Urine:15; Emesis/NG output:500]  PHYSICAL EXAMINATION: General:  Ill appearing obese woman on MV, breathing over the vent HEENT: [place]/AT, pupils are sluggish, no EOM PSY: Unresponsive Neuro: Pupils are sluggish, no EOM and no corneal reflexes CV: IRIR, tachy with Nl S1/S2. PULM: Diffuse rales GI: Obese, soft, NT, ND and +BS Extremities: warm/dry, trace pretibial edema edema  Skin: no rashes or lesions  LABS:  BMET  Recent Labs Lab 2017-03-06 1621 March 06, 2017 1634 02/12/17 0306  NA 136 137 142  K 4.1 4.1 3.2*  CL 104 99* 102  CO2  --  18* 24  BUN 27* 20 35*  CREATININE 0.90 1.32* 2.68*  GLUCOSE 354* 393* 134*   Electrolytes  Recent Labs Lab 03/06/17 1634 02/12/17 0306   CALCIUM 8.4* 7.6*  MG  --  1.5*  PHOS  --  3.5   CBC  Recent Labs Lab 2017-03-06 1621 Mar 06, 2017 1634 02/12/17 0306  WBC  --  49.7* 38.8*  HGB 14.6 14.1 15.4*  HCT 43.0 44.8 45.4  PLT  --  224 255   Coag's  Recent Labs Lab 03-06-17 1817  APTT 36   Sepsis Markers  Recent Labs Lab 2017/03/06 1649 2017/03/06 2135 02/12/17 0315  LATICACIDVEN 10.23* 5.3* 4.6*   ABG  Recent Labs Lab 2017/03/06 1951 02/12/17 0450  PHART 7.239* 7.454*  PCO2ART 43.4 36.1  PO2ART 97.0 132.0*   Liver Enzymes  Recent Labs Lab Mar 06, 2017 1634 02/12/17 0306  AST 256* 562*  ALT 145* 230*  ALKPHOS 121 85  BILITOT 0.7 1.1  ALBUMIN 3.1* 2.8*    Cardiac Enzymes  Recent Labs Lab 2017/03/06 1634  TROPONINI 0.80*    Glucose  Recent Labs Lab 02/12/17 0114 02/12/17 0231 02/12/17 0317 02/12/17 0450 02/12/17 0555 02/12/17 0650  GLUCAP 143* 148* 141* 150* 138* 150*    Imaging Ct Head Wo Contrast  Result Date: 03-06-2017 CLINICAL DATA:  Found unresponsive in a car. Cardiopulmonary resuscitation. EXAM: CT HEAD WITHOUT CONTRAST TECHNIQUE: Contiguous axial images were obtained from the base of the skull through the vertex without intravenous contrast. COMPARISON:  None. FINDINGS: Brain: Mild generalized atrophy.  No sign of old or acute infarction, mass lesion, hemorrhage, hydrocephalus or extra-axial collection. Vascular: No abnormal vascular finding. Skull: Normal Sinuses/Orbits: Extensive inflammatory sinus disease most pronounced affecting the maxillary sinuses. Other: None significant IMPRESSION: No acute intracranial finding.  Mild atrophy. Extensive paranasal sinusitis. Electronically Signed   By: Paulina FusiMark  Shogry M.D.   On: 04-08-17 19:03   Dg Chest Port 1 View  Result Date: 02/12/2017 CLINICAL DATA:  Acute respiratory failure, check endotracheal tube placement EXAM: PORTABLE CHEST 1 VIEW COMPARISON:  04-08-17 FINDINGS: Cardiac shadow is stable. Endotracheal tube, nasogastric catheter  and left jugular central line are again seen and stable. Cardiomegaly is again identified. Increasing vascular congestion with bilateral effusions and bibasilar atelectasis is noted. No bony abnormality is seen. IMPRESSION: Increasing CHF with effusions and basilar atelectasis. Tubes and lines stable in appearance. Electronically Signed   By: Alcide CleverMark  Lukens M.D.   On: 02/12/2017 08:31   Dg Chest Portable 1 View  Result Date: 07/06/2017 CLINICAL DATA:  Central line placement EXAM: PORTABLE CHEST 1 VIEW COMPARISON:  Earlier today FINDINGS: Endotracheal tube tip between the clavicular heads and carina. An orogastric tube reaches the stomach. New left IJ central line with tip at the upper cavoatrial junction. No visible pneumothorax. Stable borderline cardiomegaly, accentuated by technique. Perihilar opacities, greater on the right. No evidence of effusion. Anterior right fifth and sixth rib fractures. Anterior left fifth rib fracture is likely. IMPRESSION: 1. No acute finding related to left IJ placement. The tip is at the upper cavoatrial junction. 2. Perihilar and interstitial opacities which could be from edema or aspiration. 3. Anterior right fifth and sixth rib fractures. Probable anterior left fifth rib fracture. Electronically Signed   By: Marnee SpringJonathon  Watts M.D.   On: 04-08-17 18:44   Dg Chest Portable 1 View  Result Date: 07/06/2017 CLINICAL DATA:  Found unresponsive in vehicle.  Status post CPR. EXAM: PORTABLE CHEST 1 VIEW COMPARISON:  None. FINDINGS: ET tube tip is above the carina. There is a nasogastric tube with tip in the stomach. Heart size is normal. No pleural effusion or edema. Lungs are clear. IMPRESSION: 1. Appropriately positioned support apparatus as above. 2. No focal cardiopulmonary abnormalities noted. Electronically Signed   By: Signa Kellaylor  Stroud M.D.   On: 04-08-17 16:39   STUDIES:  CT Head 7/17 >>  UDS 7/17 >> negative  CULTURES: BCx2 7/17 >>  UA 7/17 >>  UC 7/17 >> Sputum  7/17>>  ANTIBIOTICS: Unasyn 7/17 >>   SIGNIFICANT EVENTS: 7/17  Admit post arrest, >1 hour downhtime  LINES/TUBES: ETT 7/17 >> CVC 7/17 >>   DISCUSSION: 66 y/o F admitted 7/17 after prolonged cardiac arrest.  Initial rhythm VF, >1 hour downtime.    ASSESSMENT / PLAN:  PULMONARY A: Acute Respiratory Failure in the setting of Cardiac Arrest  At risk aspiration PNA / pneumonitis P:   PRVC 8 cc/kg  Increase PEEP to 12 Decrease rate to 20 Follow up ABG in 2 hours and in am  Trend CXR and ABG daily  CARDIOVASCULAR A:  VF Arrest - prolonged downtime  Shock, likely cardiogenic Hx of CHF  P:  Admit to ICU  Not a hypothermia candidate due to down time. Will avoid fevers Heparin gtt Continue amiodarone gtt now Cardiology consulted, appreciate input  Levophed for MAP > 65 Add neo for BP support to also hopefully decrease levo and improve HR  RENAL A:   Metabolic Acidosis - lactic post arrest  P:   Trend BMP / urinary  output Replace electrolytes as indicated Avoid nephrotoxic agents, ensure adequate renal perfusion Family discussing if patient would want dialysis  GASTROINTESTINAL A:   GERD  Morbid Obesity Coffee Ground Drainage from NGT P:   NPO / OGT TF per nutrition PPI for SUP  HEMATOLOGIC A:   Mild Leukocytosis - suspect stress response  P:  Trend CBC  Hold Eliquis  Heparin gtt per Pharmacy Monitor for bleeding with coffee ground material in NGT   INFECTIOUS A:   Leukocytosis At risk aspiration PNA P:   Trend WBC Empiric unasyn and follow CXR, d/c if no infiltrates evolve  ENDOCRINE A:   Hyperglycemia P:   SSI per protocol  NEUROLOGIC A:   Acute Encephalopathy - post arrest, r/o anoxia  P:   RASS goal: 0 to -1  CT Head negative UDS negative D/C all sedation EEG ordered  FAMILY  - Updates: Had an extensive conversation with both sons, husband, father and 3 sisters.  After a long discussion, decision was made that patient would not  want CPR/cardioversion, trach, peg or SNF placement.  Basically if not able to live at home with no machines she would rather be comfortable.  Will update code status to LCB with no CPR/cardioversion.  - Inter-disciplinary family meet or Palliative Care meeting due by: 02/18/17  The patient is critically ill with multiple organ systems failure and requires high complexity decision making for assessment and support, frequent evaluation and titration of therapies, application of advanced monitoring technologies and extensive interpretation of multiple databases.   Critical Care Time devoted to patient care services described in this note is  55  Minutes. This time reflects time of care of this signee Dr Koren Bound. This critical care time does not reflect procedure time, or teaching time or supervisory time of PA/NP/Med student/Med Resident etc but could involve care discussion time.  Alyson Reedy, M.D. [facility] Pulmonary/Critical Care Medicine. Pager: [phone]. After hours pager: [phone].  02/12/2017, 9:46 AM

## 2017-02-12 NOTE — Progress Notes (Signed)
Patient's respiratory rate 30-40's and even up to the 50's with oral care, patient experiencing increased work of breathing with abdominal respirations.  Patient starting to minimally respond to stimuli, moving bilateral arms to pain.  Called MD Molli KnockYacoub, MD advised no sedation/pain medication at this time due to hypotension.  RN to continue to monitor.

## 2017-02-12 NOTE — Progress Notes (Signed)
CRITICAL VALUE ALERT  Critical Value:  PTT 184  Date & Time Notied: 02/12/17 @ 1940  Provider Notified: Dr. Dellie CatholicSommers (CCM) & Pharmacy  Orders Received/Actions taken: Results acknowledged

## 2017-02-12 NOTE — Progress Notes (Addendum)
Patient's respiratory rate 40-50's with continued increased work of breathing with abdominal respirations.  Blood pressures still labile but pressors are not currently at max rates.  Called MD Molli KnockYacoub, discussed patient's respiratory status and ABG results,  new orders received.

## 2017-02-12 NOTE — Progress Notes (Signed)
Progress Note  Patient Name: Tammie Stone Date of Encounter: 02/12/2017  Primary Cardiologist:    New  (Dr. Percival Spanish)  Subjective   Intubated and minimally responsive.    Inpatient Medications    Scheduled Meds: . chlorhexidine gluconate (MEDLINE KIT)  15 mL Mouth Rinse BID  . Chlorhexidine Gluconate Cloth  6 each Topical Daily  . insulin aspart  1-3 Units Subcutaneous Q4H  . insulin glargine  20 Units Subcutaneous Q24H  . mouth rinse  15 mL Mouth Rinse 10 times per day  . mupirocin ointment  1 application Nasal BID  . pantoprazole (PROTONIX) IV  40 mg Intravenous Q24H  . sodium chloride flush  10-40 mL Intracatheter Q12H   Continuous Infusions: . sodium chloride 250 mL (02/12/17 0800)  . amiodarone 30 mg/hr (02/12/17 0805)  . ampicillin-sulbactam (UNASYN) IV    . heparin 1,200 Units/hr (02/12/17 1218)  . norepinephrine (LEVOPHED) Adult infusion 36 mcg/min (02/12/17 1426)  . phenylephrine (NEO-SYNEPHRINE) Adult infusion 60 mcg/min (02/12/17 1426)  .  sodium bicarbonate (isotonic) infusion in sterile water 100 mL/hr at 02/12/17 0833  . vasopressin (PITRESSIN) infusion - *FOR SHOCK* 0.03 Units/min (02/12/17 0800)   PRN Meds: sodium chloride, EPINEPHrine, sodium bicarbonate, sodium chloride flush   Vital Signs    Vitals:   02/12/17 1411 02/12/17 1415 02/12/17 1423 02/12/17 1426  BP: (!) 80/55 (!) 50/25 (!) 89/67 (!) 73/23  Pulse: (!) 119 96 (!) 119 (!) 127  Resp: (!) 37 (!) 31 (!) 37 (!) 41  Temp:      TempSrc:      SpO2: 99% 99% 100% 100%  Weight:      Height:        Intake/Output Summary (Last 24 hours) at 02/12/17 1435 Last data filed at 02/12/17 1300  Gross per 24 hour  Intake          5123.37 ml  Output              515 ml  Net          4608.37 ml   Filed Weights   02/21/2017 1717 02/12/17 0348  Weight: 260 lb 2.3 oz (118 kg) 258 lb 13.1 oz (117.4 kg)    Telemetry    Atrial fib with RVR - Personally Reviewed  ECG    NA - Personally  Reviewed  Physical Exam   GEN: Critically ill Neck: No  JVD Cardiac: IrregularRR, no murmurs, rubs, or gallops.  Respiratory:   Decreased breath sounds GI: Soft, nontender, non-distended  MS: No  edema; No deformity. Neuro:    Nonresponsive Psych:  Unable to assess  Labs    Chemistry Recent Labs Lab 02/21/2017 1621 02/20/2017 1634 02/12/17 0306  NA 136 137 142  K 4.1 4.1 3.2*  CL 104 99* 102  CO2  --  18* 24  GLUCOSE 354* 393* 134*  BUN 27* 20 35*  CREATININE 0.90 1.32* 2.68*  CALCIUM  --  8.4* 7.6*  PROT  --  6.1* 5.7*  ALBUMIN  --  3.1* 2.8*  AST  --  256* 562*  ALT  --  145* 230*  ALKPHOS  --  121 85  BILITOT  --  0.7 1.1  GFRNONAA  --  42* 18*  GFRAA  --  49* 21*  ANIONGAP  --  20* 16*     Hematology Recent Labs Lab 02/09/2017 1621 02/19/2017 1634 02/12/17 0306  WBC  --  49.7* 38.8*  RBC  --  4.29 4.56  HGB 14.6 14.1 15.4*  HCT 43.0 44.8 45.4  MCV  --  104.4* 99.6  MCH  --  32.9 33.8  MCHC  --  31.5 33.9  RDW  --  14.1 14.3  PLT  --  224 255    Cardiac Enzymes Recent Labs Lab 02/09/2017 1634  TROPONINI 0.80*   No results for input(s): TROPIPOC in the last 168 hours.   BNPNo results for input(s): BNP, PROBNP in the last 168 hours.   DDimer No results for input(s): DDIMER in the last 168 hours.   Radiology    Ct Head Wo Contrast  Result Date: 01/31/2017 CLINICAL DATA:  Found unresponsive in a car. Cardiopulmonary resuscitation. EXAM: CT HEAD WITHOUT CONTRAST TECHNIQUE: Contiguous axial images were obtained from the base of the skull through the vertex without intravenous contrast. COMPARISON:  None. FINDINGS: Brain: Mild generalized atrophy. No sign of old or acute infarction, mass lesion, hemorrhage, hydrocephalus or extra-axial collection. Vascular: No abnormal vascular finding. Skull: Normal Sinuses/Orbits: Extensive inflammatory sinus disease most pronounced affecting the maxillary sinuses. Other: None significant IMPRESSION: No acute intracranial  finding.  Mild atrophy. Extensive paranasal sinusitis. Electronically Signed   By: Mark  Shogry M.D.   On: 01/29/2017 19:03   Dg Chest Port 1 View  Result Date: 02/12/2017 CLINICAL DATA:  Acute respiratory failure, check endotracheal tube placement EXAM: PORTABLE CHEST 1 VIEW COMPARISON:  02/20/2017 FINDINGS: Cardiac shadow is stable. Endotracheal tube, nasogastric catheter and left jugular central line are again seen and stable. Cardiomegaly is again identified. Increasing vascular congestion with bilateral effusions and bibasilar atelectasis is noted. No bony abnormality is seen. IMPRESSION: Increasing CHF with effusions and basilar atelectasis. Tubes and lines stable in appearance. Electronically Signed   By: Mark  Lukens M.D.   On: 02/12/2017 08:31   Dg Chest Portable 1 View  Result Date: 02/08/2017 CLINICAL DATA:  Central line placement EXAM: PORTABLE CHEST 1 VIEW COMPARISON:  Earlier today FINDINGS: Endotracheal tube tip between the clavicular heads and carina. An orogastric tube reaches the stomach. New left IJ central line with tip at the upper cavoatrial junction. No visible pneumothorax. Stable borderline cardiomegaly, accentuated by technique. Perihilar opacities, greater on the right. No evidence of effusion. Anterior right fifth and sixth rib fractures. Anterior left fifth rib fracture is likely. IMPRESSION: 1. No acute finding related to left IJ placement. The tip is at the upper cavoatrial junction. 2. Perihilar and interstitial opacities which could be from edema or aspiration. 3. Anterior right fifth and sixth rib fractures. Probable anterior left fifth rib fracture. Electronically Signed   By: Jonathon  Watts M.D.   On: 02/15/2017 18:44   Dg Chest Portable 1 View  Result Date: 02/06/2017 CLINICAL DATA:  Found unresponsive in vehicle.  Status post CPR. EXAM: PORTABLE CHEST 1 VIEW COMPARISON:  None. FINDINGS: ET tube tip is above the carina. There is a nasogastric tube with tip in the  stomach. Heart size is normal. No pleural effusion or edema. Lungs are clear. IMPRESSION: 1. Appropriately positioned support apparatus as above. 2. No focal cardiopulmonary abnormalities noted. Electronically Signed   By: Taylor  Stroud M.D.   On: 01/28/2017 16:39    Cardiac Studies   ECHO pending  Patient Profile     65 y.o. female with past medical history of chronic diastolic CHF, persistent atrial fibrillation (on Eliquis), HTN, HLD, and Type 2 DM who is being seen for the evaluation follow up after cardiac arrest  Assessment & Plan    CARDIAC ARREST:    Vfib.  Initiating event not clear.  She did not have acute ST elevation.  Check echocardiogram.    ATRIAL FIB:  Rapid rate secondary to need for vasopressors and overall critical condition.  She is on amiodarone but it is unlikely we will be able to control this rate.  She will be given mild sedation which might help.    Signed, Minus Breeding, MD  02/12/2017, 2:35 PM

## 2017-02-12 NOTE — Progress Notes (Signed)
EEG completed; results pending.    

## 2017-02-12 NOTE — Procedures (Signed)
ELECTROENCEPHALOGRAM REPORT  Date of Study: 02/12/2017  Patient's Name: Tammie Stone MRN: [id number] Date of Birth: 1950-08-29  Referring Provider: Dr. Koren BoundWesam Yacoub  Clinical History: This is a 66 year old woman s/p VF arrest, duration before ROSC approximately 1 hour.  Medications: amiodarone (NEXTERONE PREMIX) 360-4.14 MG/200ML-% (1.8 mg/mL) IV infusion  Ampicillin-Sulbactam (UNASYN) 3 g in sodium chloride 0.9 % 100 mL IVPB  EPINEPHrine (ADRENALIN) 1 MG/10ML injection  heparin ADULT infusion 100 units/mL (25000 units/25250mL sodium chloride 0.45%)  insulin aspart (novoLOG) injection 1-3 Units  insulin glargine (LANTUS) injection 20 Units  insulin regular (NOVOLIN R,HUMULIN R) 100 Units in sodium chloride 0.9 % 100 mL (1 Units/mL) infusion  magnesium sulfate IVPB 2 g 50 mL  norepinephrine (LEVOPHED) 16 mg in dextrose 5 % 250 mL (0.064 mg/mL) infusion  pantoprazole (PROTONIX) injection 40 mg  phenylephrine (NEO-SYNEPHRINE) 10 mg in sodium chloride 0.9 % 250 mL (0.04 mg/mL) infusion  sodium bicarbonate 150 mEq in sterile water 1,000 mL infusion  sodium bicarbonate injection  vasopressin (PITRESSIN) 40 Units in sodium chloride 0.9 % 250 mL (0.16 Units/mL) infusion   Technical Summary: A multichannel digital EEG recording measured by the international 10-20 system with electrodes applied with paste and impedances below 5000 ohms performed as portable with EKG monitoring in an intubated and unresponsive patient.  Hyperventilation and photic stimulation were not performed.  The digital EEG was referentially recorded, reformatted, and digitally filtered in a variety of bipolar and referential montages for optimal display.   Description: The patient is intubated and unresponsive during the recording, no sedating medications listed. There is no clear posterior dominant rhythm. The background consists of a large amount of diffuse background suppression with  4-5 Hz theta and 2-3 Hz delta  slowing seen. Throughout the recording, there are periodic generalized slow waves occurring every 2-3 seconds, with higher amplitude over the bilateral temporal regions, left greater than right. Normal sleep architecture was not seen. No reactivity to noxious stimulation. Hyperventilation and photic stimulation were not performed.  There were no clear epileptiform discharges or electrographic seizures seen.    EKG lead showed tachycardia.  Impression: This EEG is abnormal due to the presence of: 1. Diffuse slowing and suppression of background activity 2. Periodic generalized slow waves occurring every 2-3 seconds with no clear epileptiform discharges seen  Clinical Correlation of the above findings indicates severe diffuse cerebral dysfunction that is non-specific in etiology and can be seen with anoxic/ischemic injury, toxic/metabolic encephalopathies, or medication effect. There were no electrographic seizures seen in this study. Clinical correlation is advised.   Patrcia DollyKaren Jhordan Kinter, M.D.

## 2017-02-12 NOTE — Progress Notes (Signed)
eLink Physician-Brief Progress Note Patient Name: Tammie Stone DOB: 1951-07-17 MRN: [id number]   Date of Service  02/12/2017  HPI/Events of Note  Stool output need flexiseal  Rn on levophed and concern about accuracy of BP cuff She is able to take on 2 ext The pt prognosis is poor and placement of a line will not change her outcome and offers risk Hold off a line for now\ maintaining levophed but will max this soon   eICU Interventions       Intervention Category Minor Interventions: Routine modifications to care plan (e.g. PRN medications for pain, fever)  Nelda BucksFEINSTEIN,Koran Seabrook J. 02/12/2017, 12:39 AM

## 2017-02-12 NOTE — Progress Notes (Signed)
eLink Physician-Brief Progress Note Patient Name: Tammie Stone DOB: Sep 13, 1950 MRN: [id number]   Date of Service  02/12/2017  HPI/Events of Note  Tachy Control temp amio bolus k supp  eICU Interventions       Intervention Category Major Interventions: Arrhythmia - evaluation and management  Nelda BucksFEINSTEIN,DANIEL J. 02/12/2017, 4:11 AM

## 2017-02-12 NOTE — Progress Notes (Signed)
Initial Nutrition Assessment  DOCUMENTATION CODES:   Morbid obesity  INTERVENTION:   Vital High Protein @ goal rate of 50 ml/hr 30 ml Prostat TID  Provides: 1500 kcals, 150 grams protein, 1008 ml free water.   NUTRITION DIAGNOSIS:   Inadequate oral intake related to inability to eat as evidenced by NPO status.  GOAL:   Patient will meet greater than or equal to 90% of their needs  MONITOR:   TF tolerance, Labs, Vent status, I & O's  REASON FOR ASSESSMENT:   Consult Enteral/tube feeding initiation and management  ASSESSMENT:   Pt with PMH of CHF, DM, hypothyroidism, and GERD. Presents this admission for acute respiratory failure in the setting of cardiac arrest, not a hypothermia candidate due to down time.   Patient is currently intubated on ventilator support Spoke with RN who reports pt will most likely not require nutrition support today, but a possibility tomorrow. Coffee ground emesis through NGT noted 7/17. Minimal brain activity found in EEG.   Nutrition-Focused physical exam completed. Findings are no fat depletion, no muscle depletion, and mild edema.   Medications reviewed and include: SSI, levophed, Neo, vasopressin, amiodarone, sodium bicarb Labs reviewed: K 3.2 (L), CBG 134, Mg 1.5 (L), AST 562 (H),  ALT 230 (H)  Diet Order:  Diet NPO time specified  Skin:   (Left leg puncture wound)  Last BM:  01/31/2017  Height:   Ht Readings from Last 1 Encounters:  02/17/2017 5\' 5"  (1.651 m)    Weight:   Wt Readings from Last 1 Encounters:  02/12/17 258 lb 13.1 oz (117.4 kg)    Ideal Body Weight:  56.8 kg  BMI:  Body mass index is 43.07 kg/m.  Estimated Nutritional Needs:   Kcal:  1300- 1650 (11-14 kcal/kg)  Protein:  145-155 grams (2.5-2.7 g/kg IBW)  Fluid:  >1.3 L/day   EDUCATION NEEDS:   No education needs identified at this time  Vanessa Kickarly Lesa Vandall RD, LDN Pager # - [phone]

## 2017-02-12 NOTE — Care Management Note (Signed)
Case Management Note  Patient Details  Name: Tammie Stone MRN: [id number] Date of Birth: [birth year]  Subjective/Objective:   From home with spouse, presents with cardiac arrest, was found in her car, conts on hep /amio drip on vent, npo, tube feeds.                  Action/Plan: NCM will follow along as patient progresses for dc needs.   Expected Discharge Date:                  Expected Discharge Plan:     In-House Referral:     Discharge planning Services  CM Consult  Post Acute Care Choice:    Choice offered to:     DME Arranged:    DME Agency:     HH Arranged:    HH Agency:     Status of Service:  In process, will continue to follow  If discussed at Long Length of Stay Meetings, dates discussed:    Additional Comments:  Leone Havenaylor, Damieon Armendariz [place], RN 02/12/2017, 12:48 PM

## 2017-02-13 ENCOUNTER — Inpatient Hospital Stay (HOSPITAL_COMMUNITY): Payer: Medicaid Other

## 2017-02-13 DIAGNOSIS — J810 Acute pulmonary edema: Secondary | ICD-10-CM

## 2017-02-13 DIAGNOSIS — Z9289 Personal history of other medical treatment: Secondary | ICD-10-CM

## 2017-02-13 LAB — BLOOD CULTURE ID PANEL (REFLEXED)
ACINETOBACTER BAUMANNII: NOT DETECTED
CANDIDA ALBICANS: NOT DETECTED
CANDIDA GLABRATA: NOT DETECTED
CANDIDA KRUSEI: NOT DETECTED
Candida parapsilosis: NOT DETECTED
Candida tropicalis: NOT DETECTED
ENTEROBACTER CLOACAE COMPLEX: NOT DETECTED
ENTEROBACTERIACEAE SPECIES: NOT DETECTED
ESCHERICHIA COLI: NOT DETECTED
Enterococcus species: NOT DETECTED
Haemophilus influenzae: NOT DETECTED
KLEBSIELLA PNEUMONIAE: NOT DETECTED
Klebsiella oxytoca: NOT DETECTED
Listeria monocytogenes: NOT DETECTED
NEISSERIA MENINGITIDIS: NOT DETECTED
PSEUDOMONAS AERUGINOSA: NOT DETECTED
Proteus species: NOT DETECTED
STREPTOCOCCUS AGALACTIAE: NOT DETECTED
STREPTOCOCCUS PNEUMONIAE: NOT DETECTED
Serratia marcescens: NOT DETECTED
Staphylococcus aureus (BCID): NOT DETECTED
Staphylococcus species: NOT DETECTED
Streptococcus pyogenes: NOT DETECTED
Streptococcus species: NOT DETECTED

## 2017-02-13 LAB — CBC
HEMATOCRIT: 38.1 % (ref 36.0–46.0)
HEMOGLOBIN: 12.9 g/dL (ref 12.0–15.0)
MCH: 33.6 pg (ref 26.0–34.0)
MCHC: 33.9 g/dL (ref 30.0–36.0)
MCV: 99.2 fL (ref 78.0–100.0)
Platelets: 189 10*3/uL (ref 150–400)
RBC: 3.84 MIL/uL — AB (ref 3.87–5.11)
RDW: 14.5 % (ref 11.5–15.5)
WBC: 31.3 10*3/uL — ABNORMAL HIGH (ref 4.0–10.5)

## 2017-02-13 LAB — HIV ANTIBODY (ROUTINE TESTING W REFLEX): HIV Screen 4th Generation wRfx: NONREACTIVE

## 2017-02-13 LAB — URINE CULTURE: Culture: >=10000 — AB

## 2017-02-13 LAB — BASIC METABOLIC PANEL
ANION GAP: 22 — AB (ref 5–15)
BUN: 57 mg/dL — ABNORMAL HIGH (ref 6–20)
CALCIUM: 6.1 mg/dL — AB (ref 8.9–10.3)
CHLORIDE: 93 mmol/L — AB (ref 101–111)
CO2: 24 mmol/L (ref 22–32)
Creatinine, Ser: 3.96 mg/dL — ABNORMAL HIGH (ref 0.44–1.00)
GFR calc non Af Amer: 11 mL/min — ABNORMAL LOW (ref >60)
GFR, EST AFRICAN AMERICAN: 13 mL/min — AB (ref >60)
GLUCOSE: 168 mg/dL — AB (ref 65–99)
POTASSIUM: 4.3 mmol/L (ref 3.5–5.1)
Sodium: 139 mmol/L (ref 135–145)

## 2017-02-13 LAB — POCT I-STAT 3, ART BLOOD GAS (G3+)
Acid-Base Excess: 2 mmol/L (ref 0.0–2.0)
BICARBONATE: 26.6 mmol/L (ref 20.0–28.0)
O2 Saturation: 99 %
PCO2 ART: 39.6 mmHg (ref 32.0–48.0)
TCO2: 28 mmol/L (ref 0–100)
pH, Arterial: 7.435 (ref 7.350–7.450)
pO2, Arterial: 115 mmHg — ABNORMAL HIGH (ref 83.0–108.0)

## 2017-02-13 LAB — GLUCOSE, CAPILLARY
GLUCOSE-CAPILLARY: 165 mg/dL — AB (ref 65–99)
GLUCOSE-CAPILLARY: 178 mg/dL — AB (ref 65–99)

## 2017-02-13 LAB — APTT: aPTT: 119 seconds — ABNORMAL HIGH (ref 24–36)

## 2017-02-13 LAB — HEPATIC FUNCTION PANEL
ALK PHOS: 94 U/L (ref 38–126)
ALT: 274 U/L — AB (ref 14–54)
AST: 464 U/L — ABNORMAL HIGH (ref 15–41)
Albumin: 2.3 g/dL — ABNORMAL LOW (ref 3.5–5.0)
BILIRUBIN INDIRECT: 0.7 mg/dL (ref 0.3–0.9)
BILIRUBIN TOTAL: 0.9 mg/dL (ref 0.3–1.2)
Bilirubin, Direct: 0.2 mg/dL (ref 0.1–0.5)
TOTAL PROTEIN: 5.5 g/dL — AB (ref 6.5–8.1)

## 2017-02-13 LAB — PHOSPHORUS: Phosphorus: 6.8 mg/dL — ABNORMAL HIGH (ref 2.5–4.6)

## 2017-02-13 LAB — MAGNESIUM: MAGNESIUM: 1.9 mg/dL (ref 1.7–2.4)

## 2017-02-13 LAB — HEPARIN LEVEL (UNFRACTIONATED): Heparin Unfractionated: 1.36 IU/mL — ABNORMAL HIGH (ref 0.30–0.70)

## 2017-02-13 MED ORDER — FENTANYL CITRATE (PF) 100 MCG/2ML IJ SOLN: 50.0000 ug | Freq: Once | INTRAMUSCULAR | Status: DC

## 2017-02-13 MED ORDER — FENTANYL BOLUS VIA INFUSION
50.0000 ug | INTRAVENOUS | Status: DC | PRN
  Filled 2017-02-13: qty 50

## 2017-02-13 MED ORDER — LORAZEPAM BOLUS VIA INFUSION
2.0000 mg | INTRAVENOUS | Status: DC | PRN
  Filled 2017-02-13: qty 5

## 2017-02-13 MED ORDER — VITAL HIGH PROTEIN PO LIQD: 1000.0000 mL | ORAL | Status: DC

## 2017-02-13 MED ORDER — LORAZEPAM 2 MG/ML IJ SOLN
5.0000 mg/h | INTRAVENOUS | Status: DC
  Administered 2017-02-13: 5 mg/h via INTRAVENOUS
  Filled 2017-02-13: qty 25

## 2017-02-13 MED ORDER — LORAZEPAM 2 MG/ML IJ SOLN
4.0000 mg | INTRAMUSCULAR | Status: DC | PRN
Start: 2017-02-13 — End: 2017-02-13

## 2017-02-13 MED ORDER — FENTANYL 2500MCG IN NS 250ML (10MCG/ML) PREMIX INFUSION: 25.0000 ug/h | INTRAVENOUS | Status: DC

## 2017-02-13 MED ORDER — SODIUM CHLORIDE 0.9 % IV SOLN
1.0000 g | Freq: Once | INTRAVENOUS | Status: AC
  Administered 2017-02-13: 1 g via INTRAVENOUS
  Filled 2017-02-13: qty 10

## 2017-02-13 MED ORDER — SODIUM CHLORIDE 0.9 % IV SOLN
10.0000 mg/h | INTRAVENOUS | Status: DC
  Administered 2017-02-13: 10 mg/h via INTRAVENOUS
  Filled 2017-02-13: qty 10

## 2017-02-13 MED ORDER — MORPHINE BOLUS VIA INFUSION
5.0000 mg | INTRAVENOUS | Status: DC | PRN
  Filled 2017-02-13: qty 20

## 2017-02-13 MED ORDER — AMIODARONE IV BOLUS ONLY 150 MG/100ML: 150.0000 mg | Freq: Once | INTRAVENOUS | Status: DC

## 2017-02-13 MED ORDER — FUROSEMIDE 10 MG/ML IJ SOLN
8.0000 mg/h | INTRAVENOUS | Status: DC
  Filled 2017-02-13: qty 25

## 2017-02-13 MED ORDER — FENTANYL CITRATE (PF) 2500 MCG/50ML IJ SOLN
25.0000 ug/h | INTRAMUSCULAR | Status: DC
  Filled 2017-02-13: qty 100

## 2017-02-15 LAB — CULTURE, RESPIRATORY W GRAM STAIN

## 2017-02-15 LAB — CULTURE, RESPIRATORY

## 2017-02-15 LAB — CULTURE, BLOOD (ROUTINE X 2): Special Requests: ADEQUATE

## 2017-02-16 LAB — CULTURE, BLOOD (ROUTINE X 2)
Culture: NO GROWTH
Special Requests: ADEQUATE

## 2017-02-19 ENCOUNTER — Telehealth: Payer: Self-pay

## 2017-02-19 NOTE — Telephone Encounter (Signed)
On 02/19/17 I received a death certificate from [facility] (original). The death certificate is for burial. The patient is a patient of Doctor Tyson AliasFeinstein. [facility] this am for signature.  On 02/19/17 I received the death certificate back from Doctor Tyson AliasFeinstein. I got the death certificate ready and called the funeral home to let them know the death certificate was mailed to vital records per the funeral home request.

## 2017-02-26 NOTE — Progress Notes (Addendum)
ANTICOAGULATION CONSULT NOTE  Pharmacy Consult for heparin Indication: atrial fibrillation, ACS  Allergies  Allergen Reactions  . Codeine Other (See Comments)    Reaction unk  . Sulfa Antibiotics Other (See Comments)    Reaction unk    Patient Measurements: Height: 5\' 5"  (165.1 cm) Weight: 258 lb 13.1 oz (117.4 kg) IBW/kg (Calculated) : 57 Heparin Dosing Weight: 85kg  Vital Signs: Temp: 99.1 F (37.3 C) (07/18 2300) Temp Source: Other (Comment) (07/18 2300) BP: 119/103 (07/19 0445) Pulse Rate: 129 (07/19 0445)  Labs:  Recent Labs  02-09-17 1621 02-09-17 1634 02-09-17 1817 02/12/17 0306 02/12/17 1814 02/16/2017 0424  HGB 14.6 14.1  --  15.4*  --  12.9  HCT 43.0 44.8  --  45.4  --  38.1  PLT  --  224  --  255  --  189  APTT  --   --  36  --  184* 119*  HEPARINUNFRC  --   --  1.36*  --   --   --   CREATININE 0.90 1.32*  --  2.68*  --   --   TROPONINI  --  0.80*  --   --   --   --     Estimated Creatinine Clearance: 27.5 mL/min (A) (by C-G formula based on SCr of 2.68 mg/dL (H)).   Medical History: Past Medical History:  Diagnosis Date  . Arthritis   . CHF (congestive heart failure) (HCC)   . Diabetes mellitus without complication (HCC)   . Dysrhythmia    afib  . Edema    feet  . GERD (gastroesophageal reflux disease)   . Gout   . Hypothyroidism   . Palpitations    rare  . PONV (postoperative nausea and vomiting)     Medications:  Infusions:  . sodium chloride Stopped (02/06/2017 0000)  . amiodarone 30 mg/hr (02/12/17 2200)  . ampicillin-sulbactam (UNASYN) IV Stopped (02/12/17 1810)  . heparin 1,000 Units/hr (02/12/17 2200)  . norepinephrine (LEVOPHED) Adult infusion 31 mcg/min (02/12/17 2100)  . phenylephrine (NEO-SYNEPHRINE) Adult infusion 70 mcg/min (02/02/2017 0329)  . propofol (DIPRIVAN) infusion 35 mcg/kg/min (02/19/2017 0055)  .  sodium bicarbonate (isotonic) infusion in sterile water 100 mL/hr at 02/14/2017 0400  . vasopressin (PITRESSIN) infusion  - *FOR SHOCK* 0.03 Units/min (02/12/17 2118)    Assessment: [age] yof presented as cardiac arrest. ROSC achieved. Pt on chronic apixaban for history of afib. Unknown timing of last dose, but baseline heparin level elevated as expected due to influence of apixaban.    Will follow aPTTs while apixaban influencing heparin levels. Morning aPTT remains elevated at 119 after hold + dose reduction. CBC with slow downtrend, but remains in normal limits. No new s/s of bleeding noted per RN, but coffee ground output per NGT remains unchanged from previous.   Goal of Therapy:  Heparin level 0.3-0.7 units/ml aPTT 66-102 seconds Monitor platelets by anticoagulation protocol: Yes   Plan:  Decrease heparin gtt to 800 units/hr  aPTT in 8 hours  Daily heparin level, CBC, and aPTT Monitor closely for bleeding  York CeriseKatherine Cook, PharmD Clinical Pharmacist 02/10/2017 5:10 AM

## 2017-02-26 NOTE — Progress Notes (Signed)
PHARMACY - PHYSICIAN COMMUNICATION CRITICAL VALUE ALERT - BLOOD CULTURE IDENTIFICATION (BCID)  Results for orders placed or performed during the hospital encounter of May 27, 2017  Blood Culture ID Panel (Reflexed) (Collected: 2017-07-27  6:23 PM)  Result Value Ref Range   Enterococcus species NOT DETECTED NOT DETECTED   Listeria monocytogenes NOT DETECTED NOT DETECTED   Staphylococcus species NOT DETECTED NOT DETECTED   Staphylococcus aureus NOT DETECTED NOT DETECTED   Streptococcus species NOT DETECTED NOT DETECTED   Streptococcus agalactiae NOT DETECTED NOT DETECTED   Streptococcus pneumoniae NOT DETECTED NOT DETECTED   Streptococcus pyogenes NOT DETECTED NOT DETECTED   Acinetobacter baumannii NOT DETECTED NOT DETECTED   Enterobacteriaceae species NOT DETECTED NOT DETECTED   Enterobacter cloacae complex NOT DETECTED NOT DETECTED   Escherichia coli NOT DETECTED NOT DETECTED   Klebsiella oxytoca NOT DETECTED NOT DETECTED   Klebsiella pneumoniae NOT DETECTED NOT DETECTED   Proteus species NOT DETECTED NOT DETECTED   Serratia marcescens NOT DETECTED NOT DETECTED   Haemophilus influenzae NOT DETECTED NOT DETECTED   Neisseria meningitidis NOT DETECTED NOT DETECTED   Pseudomonas aeruginosa NOT DETECTED NOT DETECTED   Candida albicans NOT DETECTED NOT DETECTED   Candida glabrata NOT DETECTED NOT DETECTED   Candida krusei NOT DETECTED NOT DETECTED   Candida parapsilosis NOT DETECTED NOT DETECTED   Candida tropicalis NOT DETECTED NOT DETECTED    Name of physician (or Provider) Contacted: Feinstein  Changes to prescribed antibiotics required: None - likely contaminant.  Sallee Provencalurner, Deepa Barthel S 02/09/2017  8:46 AM

## 2017-02-26 NOTE — Progress Notes (Signed)
CRITICAL VALUE ALERT  Critical Value: Calcium 6.1  Date & Time Notied:  01/27/2017 @ 0523  Provider Notified: Pola CornElink RN [facility] @ [phone]  Orders Received/Actions taken: Awaiting orders

## 2017-02-26 NOTE — Significant Event (Signed)
Patient's spouse and son express they are ready to proceed to terminal extubation.     Tammie Stone

## 2017-02-26 NOTE — Significant Event (Signed)
Wasted approximately 239cc of morphine and approximately 44cc of ativan drips with Ellwood HandlerLisa Yow, RN. Wasted these in sink. Asiana Benninger, Charity fundraiserN.

## 2017-02-26 NOTE — Significant Event (Signed)
Witnessed waste of Morphine and Ativan with Hyiu Ksor,RN documented by Hyiu in previous note.    Mollie GermanyYow, Rubby Barbary M

## 2017-02-26 NOTE — Significant Event (Signed)
Patient expired at 1212pm, confirmed with another staff RN Roylene Reasonorey Hickling. No pulse felt, no heart sounds heard when auscultated. Family at bedside, updated by staff continuously.    Tammie Stone

## 2017-02-26 NOTE — Significant Event (Addendum)
Sedation turned back on after approximately 30 minutes off propofol. Patient with increased work of breathing, with RR in the 50-55, with increased abdominal respirations. Sedations turned back to 335mcg/kg/min.

## 2017-02-26 NOTE — Progress Notes (Addendum)
PULMONARY / CRITICAL CARE MEDICINE   Name: Tammie Stone MRN: [id number] DOB: 10/03/1950    ADMISSION DATE:  02/17/2017 CONSULTATION DATE:  02/04/2017  REFERRING MD:  Dr. Rubin Payor   CHIEF COMPLAINT:  Cardiac Arrest   HISTORY OF PRESENT ILLNESS:   66 y/o F admitted 7/17 after cardiac arrest.    The patient presented to [facility] on 7/17 after VF arrest. She was found pulseless in her car. EMS initial rhythm was VT/VF, shocked to PEA. CPR in progress on arrival to [facility]. Duration before [facility] approximately 1 hour. King airway was changed to ETT.   SUBJECTIVE:  Unresponsive Propofol required -shock remains Vent  dnr established Pos 5.6 liters  VITAL SIGNS: BP (!) 106/91   Pulse (!) 128   Temp 98.3 F (36.8 C) (Oral)   Resp (!) 35   Ht 5\' 5"  (1.651 m)   Wt 122.2 kg (269 lb 6.4 oz)   LMP 01/23/1998 (Approximate)   SpO2 100%   BMI 44.83 kg/m   HEMODYNAMICS: CVP:  [15 mmHg] 15 mmHg  VENTILATOR SETTINGS: Vent Mode: PRVC FiO2 (%):  [80 %-100 %] 80 % Set Rate:  [20 bmp] 20 bmp Vt Set:  [470 mL-580 mL] 470 mL PEEP:  [12 cmH20] 12 cmH20 Plateau Pressure:  [22 cmH20-31 cmH20] 27 cmH20  INTAKE / OUTPUT: I/O last 3 completed shifts: In: 8680.7 [I.V.:7750.7; NG/GT:80; IV Piggyback:850] Out: 672 [Urine:62; Emesis/NG output:560; Stool:50]  PHYSICAL EXAMINATION: General: intuated, not responding Neuro: perrl sliggisbh, not FC, moves eyes at times, high rr HEENT: ett, jvd obese PULM: reduced, ronchi CV:  s1 s2 IRT no r GI: obese, bs low, no r/g  Extremities: edema   LABS:  BMET  Recent Labs Lab 01/30/2017 1634 02/12/17 0306 2017-02-16 0424  NA 137 142 139  K 4.1 3.2* 4.3  CL 99* 102 93*  CO2 18* 24 24  BUN 20 35* 57*  CREATININE 1.32* 2.68* 3.96*  GLUCOSE 393* 134* 168*   Electrolytes  Recent Labs Lab 02/10/2017 1634 02/12/17 0306 2017/02/16 0424  CALCIUM 8.4* 7.6* 6.1*  MG  --  1.5* 1.9  PHOS  --  3.5 6.8*   CBC  Recent Labs Lab 02/14/2017 1634 02/12/17 0306  February 16, 2017 0424  WBC 49.7* 38.8* 31.3*  HGB 14.1 15.4* 12.9  HCT 44.8 45.4 38.1  PLT 224 255 189   Coag's  Recent Labs Lab 02/18/2017 1817 02/12/17 1814 02/16/17 0424  APTT 36 184* 119*   Sepsis Markers  Recent Labs Lab 01/30/2017 1649 02/18/2017 2135 02/12/17 0315  LATICACIDVEN 10.23* 5.3* 4.6*   ABG  Recent Labs Lab 02/12/17 0450 02/12/17 1400 02/16/17 0431  PHART 7.454* 7.400 7.435  PCO2ART 36.1 29.9* 39.6  PO2ART 132.0* 140* 115.0*   Liver Enzymes  Recent Labs Lab 01/27/2017 1634 02/12/17 0306 16-Feb-2017 0424  AST 256* 562* 464*  ALT 145* 230* 274*  ALKPHOS 121 85 94  BILITOT 0.7 1.1 0.9  ALBUMIN 3.1* 2.8* 2.3*    Cardiac Enzymes  Recent Labs Lab 02/09/2017 1634  TROPONINI 0.80*    Glucose  Recent Labs Lab 02/12/17 0920 02/12/17 1155 02/12/17 1547 02/12/17 2014 February 16, 2017 0033 02-16-2017 0808  GLUCAP 145* 172* 201* 184* 178* 165*    Imaging Dg Chest Port 1 View  Result Date: 2017-02-16 CLINICAL DATA:  Check endotracheal tube placement EXAM: PORTABLE CHEST 1 VIEW COMPARISON:  02/12/2017 FINDINGS: Cardiac shadow is mildly enlarged. Endotracheal tube, nasogastric catheter and left jugular catheter are again seen. The lungs are well aerated. Some patchy  perihilar infiltrative change is seen as well as left basilar changes stable from the prior exam. No new focal abnormality is seen. IMPRESSION: Changes of CHF and left basilar atelectasis. Electronically Signed   By: Alcide CleverMark  Lukens M.D.   On: 2016-09-27 08:11   STUDIES:  CT Head 7/17 >> neg acute UDS 7/17 >> negative  CULTURES: BCx2 7/17 >> GP rod ( contam likely) UA 7/17 >>  UC 7/17 >> Sputum 7/17>>  ANTIBIOTICS: Unasyn 7/17 >>   SIGNIFICANT EVENTS: 7/17  Admit post arrest, >1 hour downhtime  LINES/TUBES: ETT 7/17 >> CVC 7/17 >>   DISCUSSION: 66 y/o F admitted 7/17 after prolonged cardiac arrest.  Initial rhythm VF, >1 hour downtime.    ASSESSMENT / PLAN:  PULMONARY A: Acute  Respiratory Failure in the setting of Cardiac Arrest  At risk aspiration PNA / pneumonitis edema P:   ABg reviewed, reduce MV, repeat abg in am  pao2 should allow reduction in O2 needs, goal to 70% then peep 10 Cant allow continued pos balance with worsening O2 needs, pcxr edema, effusion pcxr in am  Consider lasix   CARDIOVASCULAR A:  VF Arrest - prolonged downtime  Shock, likely cardiogenic Hx of CHF  Fib rvr P:  Avoid gross pos balance, see pulm  Dc vasopressin with high risk mesenteric ischemia cvp 12, repeat Levo to map goal 60 Obtain cortisol level Echo awaited Would prefer neo to max 200 if can wean levophed off amio drip to remain, bolus amio 150, may need further   RENAL A:   ARF, ATN P:   Lasix drip needed Chem q12h kvo concentrate as able  GASTROINTESTINAL A:   GERD  Morbid Obesity Coffee Ground Drainage from NGT, r/o nontransmural ischemia post arrest Shock liver P:   NPO / OGT TF per nutrition, fluid color better, start trickle PPI for SUP  HEMATOLOGIC A:   Mild Leukocytosis - suspect stress response  anticoagulation P:  Trend CBC in am  Hold Eliquis  Heparin gtt per Pharmacy- dc as risk GI ischemia high and bleeding active concerns Repeat coags in am   INFECTIOUS A:   Leukocytosis At risk aspiration PNA P:   Trend WBC Empiric unasyn and follow CXR, d/c if no infiltrates evolve  ENDOCRINE A:   Hyperglycemia P:   SSI per protocol Get cortisol r/o ral AI  NEUROLOGIC A:   Acute Encephalopathy - post arrest, r/o anoxia  Concern severe anoxia P:   RASS goal: 0 priop dc in setting shock, add fent for dyschrony  EEG neg focus   FAMILY  - Updates: Had an extensive conversation with both sons, husband, father and 3 sisters.  After a long discussion, decision was made that patient would not want CPR/cardioversion, trach, peg or SNF placement.  Basically if not able to live at home with no machines she would rather be comfortable.   Will update code status to LCB with no CPR/cardioversion.\  Will disuss comfort care  Ccm time 45 min  Mcarthur Rossettianiel J. Tyson AliasFeinstein, MD, FACP Pgr: [phone] [facility]  Extensive discussion with family son and family ( husband agrees). We discussed the poor prognosis and likely poor quality of life. Family has decided to offer full comfort care. They are aware that the patient may be transferred to palliative care floor for continued comfort care needs. They have been fully updated on the process and expectations.

## 2017-02-26 NOTE — Progress Notes (Signed)
Pt extubated per withdrawal order.  

## 2017-02-26 NOTE — Significant Event (Signed)
Reported over to Ellwood HandlerLisa Yow, who will take over the rest of post mortem care. NT Sweeta currently getting hand prints and hair-lock for patient's family. This RN prepped patient's bilateral eyes for possible donations. No personal belongings at bedside to give to family to take home.      Tammie Stone

## 2017-02-26 DEATH — deceased

## 2017-03-29 NOTE — Discharge Summary (Signed)
Death NOte  66 y/o F admitted 7/17 after cardiac arrest.    [facility] after VF arrest. She was found pulseless in her car. EMS initial rhythm was VT/VF, shocked to PEA. [facility]. King airway was changed to ETT.  remaimed Unresponsive Propofol required -shock remains Vent  dnr established Pos 5.6 liters Was in cardiogenix shock with ARF ATN  Had an extensive conversation with both sons, husband, father and 3 sisters.  After a long discussion, decision was made that patient would not want CPR/cardioversion, trach, peg or SNF placement.  Basically if not able to live at home with no machines she would rather be comfortable.  Will update code status to LCB with no CPR/cardioversion.\ Comfort care decided  Final dx upon death  1. Severe anoxia post arrest 2. Prolonged cardiac arrest 3. VF arrest 4. Acute resp failure 5.cardiogenic shock 6 comfort care  Mcarthur Rossettianiel J. Tyson AliasFeinstein, MD, FACP Pgr: [phone] [facility]

## 2017-09-25 IMAGING — DX DG CHEST 1V PORT
1 series · 1 of 1 positions shown · non-contrast
Comparison: 02/12/2017

CLINICAL DATA: Check endotracheal tube placement

EXAM:
PORTABLE CHEST 1 VIEW

[chest ap]
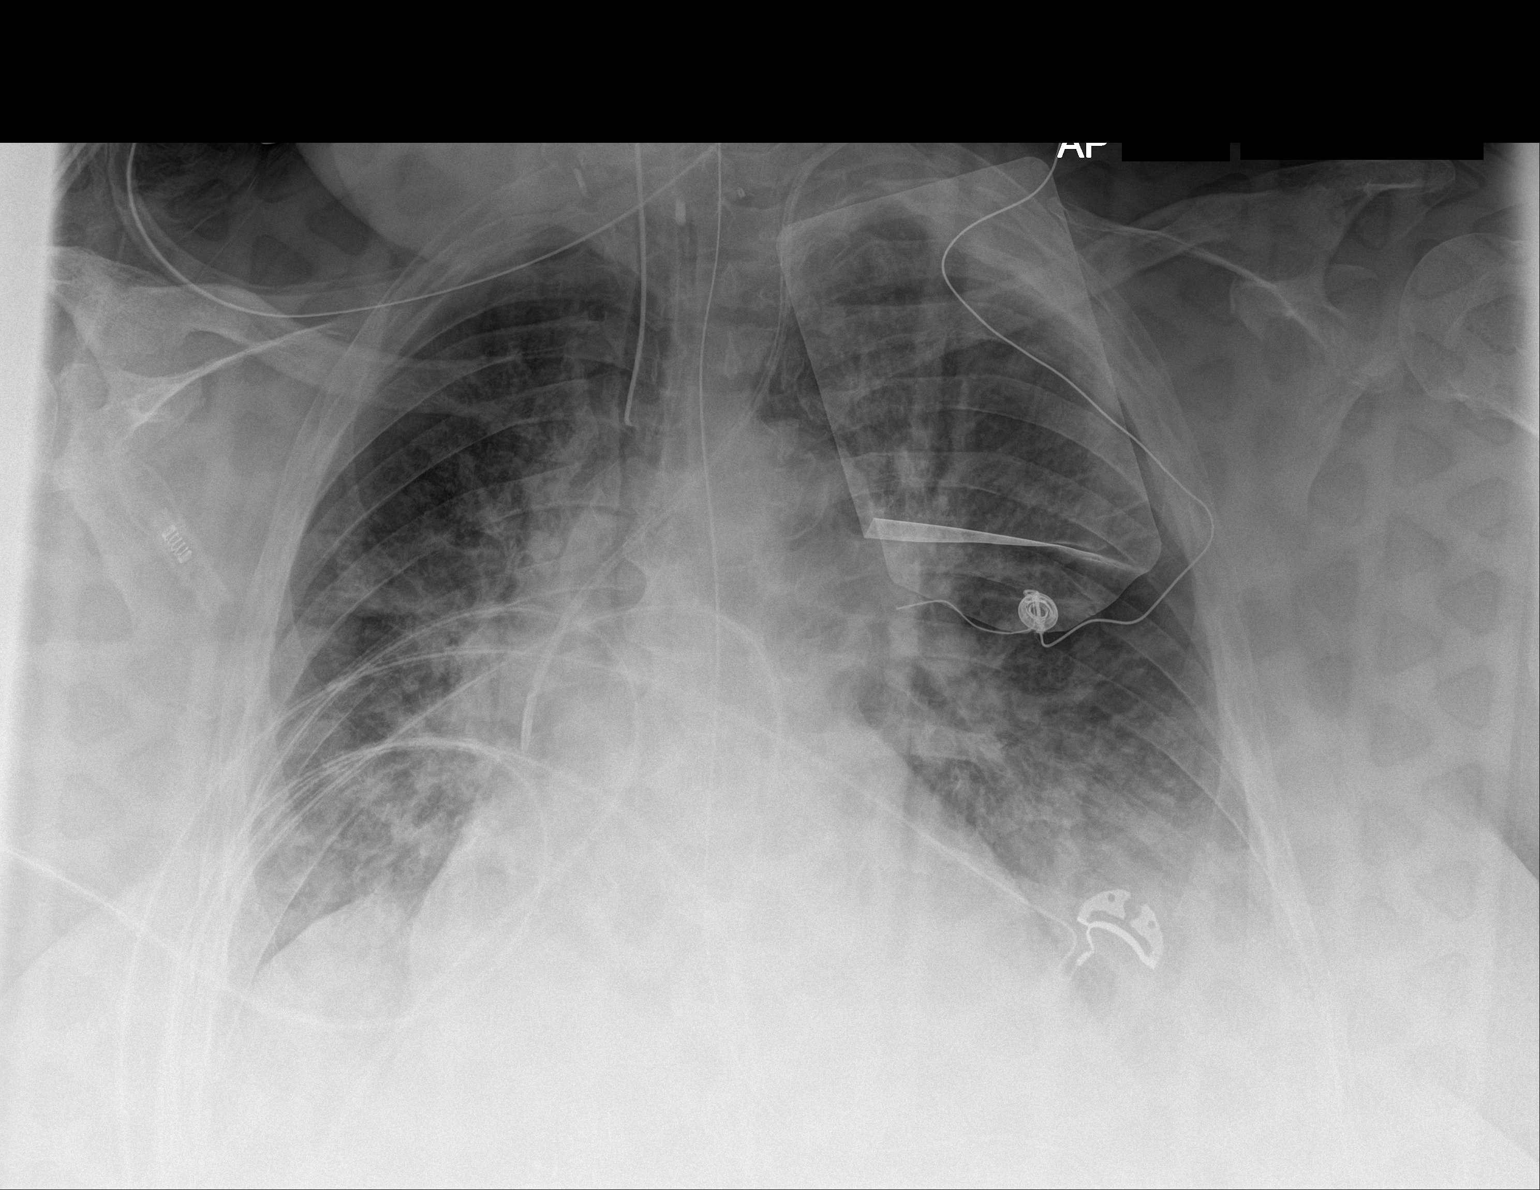

[1 of 1 positions shown; findings below may reference images not displayed]

FINDINGS: Cardiac shadow is mildly enlarged. Endotracheal tube, nasogastric
catheter and left jugular catheter are again seen. The lungs are
well aerated. Some patchy perihilar infiltrative change is seen as
well as left basilar changes stable from the prior exam. No new
focal abnormality is seen.
IMPRESSION: Changes of CHF and left basilar atelectasis.
